# Patient Record
Sex: Female | Born: 1960 | Race: White | Hispanic: No | Marital: Married | Smoking: Never smoker
Health system: Southern US, Community
[De-identification: ages and names within clinical notes are randomized; demographics above are authoritative.]

## PROBLEM LIST (undated history)

## (undated) ENCOUNTER — Emergency Department (HOSPITAL_BASED_OUTPATIENT_CLINIC_OR_DEPARTMENT_OTHER): Payer: Managed Care, Other (non HMO)

## (undated) DIAGNOSIS — J42 Unspecified chronic bronchitis: Secondary | ICD-10-CM

## (undated) DIAGNOSIS — F32A Depression, unspecified: Secondary | ICD-10-CM

## (undated) DIAGNOSIS — T8859XA Other complications of anesthesia, initial encounter: Secondary | ICD-10-CM

## (undated) DIAGNOSIS — D649 Anemia, unspecified: Secondary | ICD-10-CM

## (undated) DIAGNOSIS — F419 Anxiety disorder, unspecified: Secondary | ICD-10-CM

## (undated) DIAGNOSIS — D126 Benign neoplasm of colon, unspecified: Secondary | ICD-10-CM

## (undated) DIAGNOSIS — K219 Gastro-esophageal reflux disease without esophagitis: Secondary | ICD-10-CM

## (undated) DIAGNOSIS — T7840XA Allergy, unspecified, initial encounter: Secondary | ICD-10-CM

## (undated) DIAGNOSIS — M1990 Unspecified osteoarthritis, unspecified site: Secondary | ICD-10-CM

## (undated) DIAGNOSIS — M8580 Other specified disorders of bone density and structure, unspecified site: Secondary | ICD-10-CM

## (undated) DIAGNOSIS — K921 Melena: Secondary | ICD-10-CM

## (undated) DIAGNOSIS — E7800 Pure hypercholesterolemia, unspecified: Secondary | ICD-10-CM

## (undated) DIAGNOSIS — T4145XA Adverse effect of unspecified anesthetic, initial encounter: Secondary | ICD-10-CM

## (undated) DIAGNOSIS — Z8719 Personal history of other diseases of the digestive system: Secondary | ICD-10-CM

## (undated) HISTORY — PX: FOOT SURGERY: SHX648

## (undated) HISTORY — PX: EYE SURGERY: SHX253

## (undated) HISTORY — PX: COLONOSCOPY: SHX174

## (undated) HISTORY — DX: Melena: K92.1

## (undated) HISTORY — DX: Other specified disorders of bone density and structure, unspecified site: M85.80

## (undated) HISTORY — PX: HAND SURGERY: SHX662

## (undated) HISTORY — PX: RECTAL POLYPECTOMY: SHX2309

## (undated) HISTORY — DX: Anxiety disorder, unspecified: F41.9

## (undated) HISTORY — DX: Benign neoplasm of colon, unspecified: D12.6

## (undated) HISTORY — DX: Allergy, unspecified, initial encounter: T78.40XA

## (undated) HISTORY — DX: Unspecified osteoarthritis, unspecified site: M19.90

## (undated) HISTORY — PX: APPENDECTOMY: SHX54

---

## 2011-04-20 DIAGNOSIS — M19049 Primary osteoarthritis, unspecified hand: Secondary | ICD-10-CM | POA: Insufficient documentation

## 2012-12-06 ENCOUNTER — Ambulatory Visit (INDEPENDENT_AMBULATORY_CARE_PROVIDER_SITE_OTHER): Payer: Managed Care, Other (non HMO) | Admitting: Family Medicine

## 2012-12-06 ENCOUNTER — Encounter: Payer: Self-pay | Admitting: Family Medicine

## 2012-12-06 ENCOUNTER — Ambulatory Visit (HOSPITAL_BASED_OUTPATIENT_CLINIC_OR_DEPARTMENT_OTHER)
Admission: RE | Admit: 2012-12-06 | Discharge: 2012-12-06 | Disposition: A | Discharge status: Home / Self Care | Payer: Managed Care, Other (non HMO) | Source: Ambulatory Visit | Attending: Family Medicine | Admitting: Family Medicine

## 2012-12-06 VITALS — BP 129/88 | HR 80

## 2012-12-06 DIAGNOSIS — M79672 Pain in left foot: Secondary | ICD-10-CM

## 2012-12-06 DIAGNOSIS — M79609 Pain in unspecified limb: Secondary | ICD-10-CM

## 2012-12-06 NOTE — Patient Instructions (Signed)
Stop running for now. Ok to cycle, swim for exercise if not painful. Icing, ibuprofen as needed for pain and swelling. Get x-rays before you leave today. We will go ahead with an MRI to further assess for stress fracture of your calcaneus.

## 2012-12-08 ENCOUNTER — Encounter: Payer: Self-pay | Admitting: Family Medicine

## 2012-12-08 DIAGNOSIS — M79672 Pain in left foot: Secondary | ICD-10-CM | POA: Insufficient documentation

## 2012-12-08 NOTE — Progress Notes (Addendum)
Patient ID: Rachel Fitzpatrick, female   DOB: [birth year], 52 y.o.   MRN: [id number]  PCP: No primary provider on file.  Subjective:   HPI: Patient is a 52 y.o. female here for left heel pain.  Patient is an avid runner. Reports on Sunday she was running at [facility] and right toes were hurting - did about 8 miles. This seemed to resolve but left heel medially started hurting. Pain significantly worse with running, having difficulty walking now. Associated swelling. Has been icing, taking ibuprofen. No known injury. No prior history of stress fracture.  History reviewed. No pertinent past medical history.  No current outpatient prescriptions on file prior to visit.   No current facility-administered medications on file prior to visit.    Past Surgical History  Procedure Laterality Date  . Hand surgery      tendon repair  . Rectal polypectomy      No Known Allergies  History   Social History  . Marital Status: Legally Separated    Spouse Name: N/A    Number of Children: N/A  . Years of Education: N/A   Occupational History  . Not on file.   Social History Main Topics  . Smoking status: Never Smoker   . Smokeless tobacco: Not on file  . Alcohol Use: Not on file  . Drug Use: Not on file  . Sexual Activity: Not on file   Other Topics Concern  . Not on file   Social History Narrative  . No narrative on file    Family History  Problem Relation Age of Onset  . Hyperlipidemia Mother   . Hyperlipidemia Father   . Diabetes Father   . Sudden death Neg Hx   . Heart attack Neg Hx   . Hypertension Neg Hx     BP 129/88  Pulse 80  Review of Systems: See HPI above.    Objective:  Physical Exam:  Gen: NAD  Left foot/ankle: Mod swelling medial calcaneus area.  No bruising, other deformity. FROM ankle without pain, 5/5 strength all directions. TTP focally medial > lateral calcaneus.  No peroneal, post tib tendon tenderness.  No malleolar, base 5th, navicular,  other foot/ankle TTP. Negative ant drawer and talar tilt.   Negative syndesmotic compression. Positive calcaneal squeeze. Positive hop test. Thompsons test negative. NV intact distally.    Assessment & Plan:  1. Left foot pain - severe pain, swelling in a runner, pain worse with running.  Focal tenderness medial calcaneus.  Concerning for a stress fracture.  Radiographs negative.  Will move forward with an MRI to further assess.  Stop running in meantime.  Icing, ibuprofen.  Addendum:  MRI denied by patient's insurance carrier.  Protocol requires 14 days of conservative treatment, reevaluation and repeat x-rays.  If those are negative then MRI would be approved.

## 2012-12-08 NOTE — Assessment & Plan Note (Signed)
severe pain, swelling in a runner, pain worse with running.  Focal tenderness medial calcaneus.  Concerning for a stress fracture.  Radiographs negative.  Will move forward with an MRI to further assess.  Stop running in meantime.  Icing, ibuprofen.

## 2012-12-19 ENCOUNTER — Ambulatory Visit (INDEPENDENT_AMBULATORY_CARE_PROVIDER_SITE_OTHER): Payer: Managed Care, Other (non HMO) | Admitting: Family Medicine

## 2012-12-19 ENCOUNTER — Ambulatory Visit (HOSPITAL_BASED_OUTPATIENT_CLINIC_OR_DEPARTMENT_OTHER)
Admission: RE | Admit: 2012-12-19 | Discharge: 2012-12-19 | Disposition: A | Discharge status: Home / Self Care | Payer: Managed Care, Other (non HMO) | Source: Ambulatory Visit | Attending: Family Medicine | Admitting: Family Medicine

## 2012-12-19 ENCOUNTER — Encounter: Payer: Self-pay | Admitting: Family Medicine

## 2012-12-19 VITALS — BP 108/71 | HR 81 | Ht 62.0 in | Wt 135.0 lb

## 2012-12-19 DIAGNOSIS — M79672 Pain in left foot: Secondary | ICD-10-CM

## 2012-12-19 DIAGNOSIS — M79609 Pain in unspecified limb: Secondary | ICD-10-CM | POA: Insufficient documentation

## 2012-12-20 ENCOUNTER — Ambulatory Visit: Payer: Managed Care, Other (non HMO) | Admitting: Family Medicine

## 2012-12-20 ENCOUNTER — Encounter: Payer: Self-pay | Admitting: Family Medicine

## 2012-12-20 NOTE — Assessment & Plan Note (Signed)
severe pain, swelling in a runner, pain worse with running.  Repeated radiographs after 2 weeks of rest and still negative - will move forward with an MRI to further assess for calcaneal stress fracture. Icing, ibuprofen.

## 2012-12-20 NOTE — Patient Instructions (Signed)
Moving forward with MRI as discussed.

## 2012-12-20 NOTE — Progress Notes (Signed)
Patient ID: Rachel Fitzpatrick, female   DOB: 1961/02/15, 52 y.o.   MRN: [id number]  PCP: No primary provider on file.  Subjective:   HPI: Patient is a 52 y.o. female here for left heel pain.  10/8: Patient is an avid runner. Reports on Sunday she was running at [facility] and right toes were hurting - did about 8 miles. This seemed to resolve but left heel medially started hurting. Pain significantly worse with running, having difficulty walking now. Associated swelling. Has been icing, taking ibuprofen. No known injury. No prior history of stress fracture.  10/21: Patient returns with persistent 8/10 pain. Tried using a walking hard soled shoe and still a lot of pain. Has not been running. Icing. Some swelling. Pain medial and lateral heel.  History reviewed. No pertinent past medical history.  Current Outpatient Prescriptions on File Prior to Visit  Medication Sig Dispense Refill  . Calcium Carbonate (CALTRATE 600 PO) Take by mouth.      . estrogens conjugated, synthetic A, (CENESTIN) 0.3 MG tablet Take 0.3 mg by mouth daily.      . sertraline (ZOLOFT) 100 MG tablet Take 100 mg by mouth daily.       No current facility-administered medications on file prior to visit.    Past Surgical History  Procedure Laterality Date  . Hand surgery      tendon repair  . Rectal polypectomy      No Known Allergies  History   Social History  . Marital Status: Legally Separated    Spouse Name: N/A    Number of Children: N/A  . Years of Education: N/A   Occupational History  . Not on file.   Social History Main Topics  . Smoking status: Never Smoker   . Smokeless tobacco: Not on file  . Alcohol Use: Not on file  . Drug Use: Not on file  . Sexual Activity: Not on file   Other Topics Concern  . Not on file   Social History Narrative  . No narrative on file    Family History  Problem Relation Age of Onset  . Hyperlipidemia Mother   . Hyperlipidemia Father   .  Diabetes Father   . Sudden death Neg Hx   . Heart attack Neg Hx   . Hypertension Neg Hx     BP 108/71  Pulse 81  Ht 5\' 2"  (1.575 m)  Wt 135 lb (61.236 kg)  BMI 24.69 kg/m2  Review of Systems: See HPI above.    Objective:  Physical Exam:  Gen: NAD  Left foot/ankle: Mild-mod swelling medial, lateral  calcaneus area.  No bruising, other deformity. FROM ankle without pain, 5/5 strength all directions. TTP focally medial > lateral calcaneus.  No peroneal, post tib tendon tenderness.  No malleolar, base 5th, navicular, other foot/ankle TTP. Negative ant drawer and talar tilt.   Negative syndesmotic compression. Positive calcaneal squeeze. Positive hop test. Thompsons test negative. NV intact distally.    Assessment & Plan:  1. Left foot pain - severe pain, swelling in a runner, pain worse with running.  Repeated radiographs after 2 weeks of rest and still negative - will move forward with an MRI to further assess for calcaneal stress fracture. Icing, ibuprofen.

## 2012-12-23 ENCOUNTER — Ambulatory Visit (HOSPITAL_BASED_OUTPATIENT_CLINIC_OR_DEPARTMENT_OTHER)
Admission: RE | Admit: 2012-12-23 | Discharge: 2012-12-23 | Disposition: A | Discharge status: Home / Self Care | Payer: Managed Care, Other (non HMO) | Source: Ambulatory Visit | Attending: Family Medicine | Admitting: Family Medicine

## 2012-12-23 DIAGNOSIS — X58XXXA Exposure to other specified factors, initial encounter: Secondary | ICD-10-CM | POA: Insufficient documentation

## 2012-12-23 DIAGNOSIS — M79672 Pain in left foot: Secondary | ICD-10-CM

## 2012-12-23 DIAGNOSIS — R609 Edema, unspecified: Secondary | ICD-10-CM | POA: Insufficient documentation

## 2012-12-23 DIAGNOSIS — M8430XA Stress fracture, unspecified site, initial encounter for fracture: Secondary | ICD-10-CM | POA: Insufficient documentation

## 2012-12-23 DIAGNOSIS — S93439A Sprain of tibiofibular ligament of unspecified ankle, initial encounter: Secondary | ICD-10-CM | POA: Insufficient documentation

## 2012-12-23 DIAGNOSIS — M25579 Pain in unspecified ankle and joints of unspecified foot: Secondary | ICD-10-CM | POA: Insufficient documentation

## 2012-12-26 ENCOUNTER — Other Ambulatory Visit: Payer: Self-pay | Admitting: *Deleted

## 2012-12-26 ENCOUNTER — Ambulatory Visit (INDEPENDENT_AMBULATORY_CARE_PROVIDER_SITE_OTHER): Payer: Managed Care, Other (non HMO) | Admitting: Family Medicine

## 2012-12-26 DIAGNOSIS — M79609 Pain in unspecified limb: Secondary | ICD-10-CM

## 2012-12-26 DIAGNOSIS — M79672 Pain in left foot: Secondary | ICD-10-CM

## 2012-12-26 NOTE — Progress Notes (Signed)
Patient's MRI was reviewed and discussed - she does have a posterior calcaneal stress fracture confirmed by the MRI.  Still in a lot of pain.  Discussed options of cam walker, casting, knee scooter, crutches.  Will follow up in about 4 weeks.  Patient came in to the office, got Cam Walker and Crutches.

## 2013-01-17 ENCOUNTER — Encounter: Payer: Self-pay | Admitting: Sports Medicine

## 2013-01-17 ENCOUNTER — Ambulatory Visit (INDEPENDENT_AMBULATORY_CARE_PROVIDER_SITE_OTHER): Payer: Managed Care, Other (non HMO) | Admitting: Sports Medicine

## 2013-01-17 VITALS — BP 114/73 | Ht 62.0 in | Wt 139.0 lb

## 2013-01-17 DIAGNOSIS — M216X9 Other acquired deformities of unspecified foot: Secondary | ICD-10-CM

## 2013-01-17 DIAGNOSIS — M8430XA Stress fracture, unspecified site, initial encounter for fracture: Secondary | ICD-10-CM

## 2013-01-17 DIAGNOSIS — M21619 Bunion of unspecified foot: Secondary | ICD-10-CM | POA: Insufficient documentation

## 2013-01-17 DIAGNOSIS — M2010 Hallux valgus (acquired), unspecified foot: Secondary | ICD-10-CM

## 2013-01-17 DIAGNOSIS — M84376A Stress fracture, unspecified foot, initial encounter for fracture: Secondary | ICD-10-CM | POA: Insufficient documentation

## 2013-01-17 NOTE — Patient Instructions (Signed)
Thank you for coming in today  Return in 3 weeks and bring your running shoes We will need to make you an orthotic in the future Wear boot for 3 more weeks Do exercises: - Quad: Straight leg raise, leg extension - Hamstring: curls, leg extension - Hip: Hip abduction, hip rotation Try OTC hydrocortisone cream for rash

## 2013-01-17 NOTE — Progress Notes (Signed)
CC: Left calcaneal stress fracture HPI: Patient is a very pleasant 52 year old female runner who presents for followup of left calcaneal stress fracture. She states that this happened on October 5 when she was doing a mile run. She had pain in her left heel. She went and saw Dr. Pearletha Forge who had concern for a stress fracture but ultrasound and x-ray were negative. He instructed her not to run. Her pain worsened over the next several days to the point where she couldn't really even walk. An MRI was ordered but initially denied by her insurance company. She finally was able to obtain an MRI on October 26 which showed a left calcaneal stress fracture. She was then put in a Personal assistant. She has been in a boot for the last 3 weeks. Prior to onset of her pain she was training for a half marathon and doing about 16 miles per week running 3 days per week. There were slowly increasing her mileage by about 1 mile per week. She did run a new shoes that day. She has previously had bunion surgery on the left foot. She has been wearing super feet shoe insoles.   ROS: As above in the HPI. All other systems are stable or negative.  OBJECTIVE: APPEARANCE:  Patient in no acute distress.The patient appeared well nourished and normally developed. HEENT: No scleral icterus. Conjunctiva non-injected Resp: Non labored Skin: No rash MSK:  - She is exquisitely tender over the left medial calcaneus -  Status post bunion surgery on the left - There are bunionette bilaterally - Large right bunion - Cavus foot bilaterally - Left leg is 1 cm longer  ASSESSMENT:  #1. Left calcaneal stress fracture  PLAN:  given and the patient is still extremely tender on exam, we'll continue her in the boot for the next 3 weeks. She was given some quad, hamstring, and hip exercises to maintain strength. She may continue to do her upper body exercises at the gym. She does have biomechanical and foot structure factors that predispose  her to this condition so she will likely need custom orthotics with first ray post in the future. When she returns she should bring her running shoes. If she is nontender at that time may consider ultrasound to look for healing.

## 2013-02-01 ENCOUNTER — Encounter: Payer: Self-pay | Admitting: Sports Medicine

## 2013-02-01 ENCOUNTER — Ambulatory Visit (INDEPENDENT_AMBULATORY_CARE_PROVIDER_SITE_OTHER): Payer: Managed Care, Other (non HMO) | Admitting: Sports Medicine

## 2013-02-01 VITALS — BP 118/80 | HR 81 | Ht 62.0 in | Wt 139.0 lb

## 2013-02-01 DIAGNOSIS — Z780 Asymptomatic menopausal state: Secondary | ICD-10-CM

## 2013-02-01 DIAGNOSIS — M8438XD Stress fracture, other site, subsequent encounter for fracture with routine healing: Secondary | ICD-10-CM

## 2013-02-01 DIAGNOSIS — S92009D Unspecified fracture of unspecified calcaneus, subsequent encounter for fracture with routine healing: Secondary | ICD-10-CM

## 2013-02-01 DIAGNOSIS — M8448XD Pathological fracture, other site, subsequent encounter for fracture with routine healing: Secondary | ICD-10-CM

## 2013-02-01 DIAGNOSIS — IMO0001 Reserved for inherently not codable concepts without codable children: Secondary | ICD-10-CM

## 2013-02-01 NOTE — Progress Notes (Signed)
Patient ID: Rachel Fitzpatrick, female   DOB: Jun 13, 1960, 52 y.o.   MRN: [id number]  Patient is returning for follow up of calcaneal stress fracture On MRI this is very large an looks to be Grade 4 Occurred from running  No Hx of osteoporosis [place] descent LMP > 1 year ago  Now 70 to 80 % less pain since in boot She does feel pain with walking in shoe at home but not as severe  Consideration of whether to start on orthotics  PEXAM Patient has boot on left foot When removed only mildly TTP now but was severe last visit No obvious swelling Loss of long arch Bunion scar on LT  RT foot shows large bunion Callus under MT2 and 3  Standing Grt toe rides over toe 2  MRI reviewed with grade 4 stress fx  [place] today shows that she is starting to develop callus medial and lateral calcaneus

## 2013-02-01 NOTE — Patient Instructions (Signed)
You have been scheduled for a bone density test on 02/08/13 at 1:15 pm at  [facility] at [facility] [address] [place] [phone]

## 2013-02-01 NOTE — Assessment & Plan Note (Addendum)
This is advanced - grade 4 by MRI  Not able to walk in sport shoe without limp Heel cup makes pain less  Keep up Ca and Vit D Add Vit C 500  Follow w [place] since I can see Fx line  Check DEXA since this is an advanced FX  Cont boot for next month Scan tis on RTC Try to move to custom orthotic when no limp and less pain  On RT foot apply MT pad and see if this helps

## 2013-02-08 ENCOUNTER — Ambulatory Visit
Admission: RE | Admit: 2013-02-08 | Discharge: 2013-02-08 | Disposition: A | Discharge status: Home / Self Care | Payer: Managed Care, Other (non HMO) | Source: Ambulatory Visit | Attending: Sports Medicine | Admitting: Sports Medicine

## 2013-02-08 DIAGNOSIS — Z780 Asymptomatic menopausal state: Secondary | ICD-10-CM

## 2013-03-06 ENCOUNTER — Encounter: Payer: Self-pay | Admitting: Sports Medicine

## 2013-03-06 ENCOUNTER — Ambulatory Visit (INDEPENDENT_AMBULATORY_CARE_PROVIDER_SITE_OTHER): Payer: Managed Care, Other (non HMO) | Admitting: Sports Medicine

## 2013-03-06 VITALS — BP 110/70 | Ht 62.0 in | Wt 139.0 lb

## 2013-03-06 DIAGNOSIS — M8438XG Stress fracture, other site, subsequent encounter for fracture with delayed healing: Secondary | ICD-10-CM

## 2013-03-06 DIAGNOSIS — M8448XD Pathological fracture, other site, subsequent encounter for fracture with routine healing: Secondary | ICD-10-CM

## 2013-03-06 NOTE — Patient Instructions (Addendum)
Keep boot around if too much foot pain  This is healing well  Use 1200 Calcium and 800 IU Vit D every day  Start with some elliptical - try 10 mins a day x 3 along with some biking (time OK) After 3 days - try 15 mins on ellipitical x [redacted] week along with biking Week 2 - 20 minutes a day plus bike Week 3 - 25 mins. Week 4 30 mins  Then you are OK to try some regular walk run 24min/ 26min up to 20 mins. Next week up to 26 Mins Next week up to 32 mins  ORTHOTICS HERE  Then OK to run continuously 20 mins Add 5 mins per week  See me in 2 months  If pain back down 1 level after 2 days of rest

## 2013-03-07 NOTE — Progress Notes (Signed)
Patient ID: Rachel Fitzpatrick, female   DOB: 17-Jan-1961, 53 y.o.   MRN: [id number]  Patient returns now 8 weeks after left calcaneal stress fracture This was documented on MRI This was a high-grade stress fracture at least grade 4 She wears a cam walker  On last visit she should callus on ultrasound  She is taking calcium and vitamin D   Bone density testing showed osteopenia  Pain is minimal now  Physical examination No acute distress BP 110/70  Ht 5\' 2"  (1.575 m)  Wt 139 lb (63.05 kg)  BMI 25.42 kg/m2  Squeeze test of calcaneus is nontender No swelling Achilles tendon nontender Plantar fascia nontender  Ultrasound shows what appears to be hard callus with normal healing of her stress fracture  Walking gait is without any significantly limp

## 2013-03-07 NOTE — Assessment & Plan Note (Signed)
Very nice progress with this stress fracture  Continue calcium and vitamin D Continue vitamin C  Gradually progressive exercise program  Custom orthotics before beginning a running program  Recheck 2 months

## 2013-05-08 ENCOUNTER — Encounter: Payer: Self-pay | Admitting: Sports Medicine

## 2013-05-08 ENCOUNTER — Ambulatory Visit (INDEPENDENT_AMBULATORY_CARE_PROVIDER_SITE_OTHER): Payer: Managed Care, Other (non HMO) | Admitting: Sports Medicine

## 2013-05-08 VITALS — BP 111/74 | HR 73 | Ht 62.0 in | Wt 139.0 lb

## 2013-05-08 DIAGNOSIS — M216X9 Other acquired deformities of unspecified foot: Secondary | ICD-10-CM

## 2013-05-08 DIAGNOSIS — M21619 Bunion of unspecified foot: Secondary | ICD-10-CM

## 2013-05-08 DIAGNOSIS — M8430XA Stress fracture, unspecified site, initial encounter for fracture: Secondary | ICD-10-CM

## 2013-05-08 DIAGNOSIS — M545 Low back pain, unspecified: Secondary | ICD-10-CM

## 2013-05-08 DIAGNOSIS — M84376A Stress fracture, unspecified foot, initial encounter for fracture: Secondary | ICD-10-CM

## 2013-05-08 NOTE — Progress Notes (Signed)
Patient ID: MINNAH LLAMAS, female   DOB: 12/07/1960, 53 y.o.   MRN: [id number]  PATIENT returns for follow up visit for calcneal stress fracture On last visit she had hard callus by [place] Started on progressive increase in run/walk Now up to 40 mins without left heel pain  Now more pain under RT MT area Comes for orthotics before restarting training  Exam  NAD Leg length OK RT foot bunion and bunionette Loss of transverse arch Drop of 4th MT  LT foot Bunionette Scar from bunion surgery Heel squeeze and percussion are pain free

## 2013-05-08 NOTE — Assessment & Plan Note (Signed)
Monitor to see if improved with orthotics

## 2013-05-08 NOTE — Assessment & Plan Note (Signed)
[place] today shows no sign of stress fx/ exam neg  OK to return to full training

## 2013-05-08 NOTE — Assessment & Plan Note (Signed)
Considering bilat bunion and bunionettes and now with calcaneal stress fx that has healed  Needs to use orthtoics to see if we can stop foot breakdown  Patient was fitted for a : standard, cushioned, semi-rigid orthotic. The orthotic was heated and afterward the patient stood on the orthotic blank positioned on the orthotic stand. The patient was positioned in subtalar neutral position and 10 degrees of ankle dorsiflexion in a weight bearing stance. After completion of molding, a stable base was applied to the orthotic blank. The blank was ground to a stable position for weight bearing. Size: 7 Base: red EVA Posting: RT MT cushion on plantar surface of orthotic Additional orthotic padding: none  Time for evaluationa nd preparation of orthotic - 50 mins  Reck after using 2 to 3 mos

## 2013-05-24 ENCOUNTER — Telehealth: Payer: Self-pay | Admitting: *Deleted

## 2013-05-24 NOTE — Telephone Encounter (Signed)
Per Dr. Oneida Alar advised pt she was ahead of schedule on healing, and she may have been doing too much too soon.  Dr. Oneida Alar advised her to walk on the treadmill, use elliptical or bike for the next 7-10 days, then ease slowly back in to run/walk program.  Let us know if she continues to have pain. Pt agreeable.

## 2013-05-24 NOTE — Telephone Encounter (Signed)
Pt states she ran/walked a 5K on Saturday and ran/walked on Monday and started having mild left heel pain.  She was wearing new custom orthotics.

## 2013-05-24 NOTE — Telephone Encounter (Signed)
Message copied by Arnette Schaumann C on Thu May 24, 2013 10:22 AM ------      Message from: CERESI, MELANIE L      Created: Thu May 24, 2013  9:53 AM      Regarding: question      Contact: [phone]       Pt states her foot is painful, she only ran a little bit on Sunday and Monday.  Shes not sure if its the orthotics or not.  Wanted to talk to you before scheduling an appt or coming in for adjustments. ------

## 2013-06-08 ENCOUNTER — Telehealth: Payer: Self-pay | Admitting: *Deleted

## 2013-06-08 NOTE — Telephone Encounter (Signed)
Pt states her heel is still painful, worried that she needs to be back in boot- scheduled her with Dr Micheline Chapman Monday 06/11/13 to check on it.

## 2013-06-11 ENCOUNTER — Ambulatory Visit
Admission: RE | Admit: 2013-06-11 | Discharge: 2013-06-11 | Disposition: A | Discharge status: Home / Self Care | Payer: Managed Care, Other (non HMO) | Source: Ambulatory Visit | Attending: Sports Medicine | Admitting: Sports Medicine

## 2013-06-11 ENCOUNTER — Ambulatory Visit (INDEPENDENT_AMBULATORY_CARE_PROVIDER_SITE_OTHER): Payer: Managed Care, Other (non HMO) | Admitting: Sports Medicine

## 2013-06-11 ENCOUNTER — Encounter: Payer: Self-pay | Admitting: Sports Medicine

## 2013-06-11 VITALS — BP 114/61 | Ht 61.0 in | Wt 145.0 lb

## 2013-06-11 DIAGNOSIS — M79672 Pain in left foot: Secondary | ICD-10-CM

## 2013-06-11 DIAGNOSIS — M84376A Stress fracture, unspecified foot, initial encounter for fracture: Secondary | ICD-10-CM

## 2013-06-11 DIAGNOSIS — M8430XA Stress fracture, unspecified site, initial encounter for fracture: Secondary | ICD-10-CM

## 2013-06-11 DIAGNOSIS — M79609 Pain in unspecified limb: Secondary | ICD-10-CM

## 2013-06-11 NOTE — Progress Notes (Signed)
   Subjective:    Patient ID: Rachel Fitzpatrick, female    DOB: May 28, 1960, 53 y.o.   MRN: [id number]  HPI Patient comes in today with returning left heel pain. She has a well-documented history of a previous stress fracture in this same heel. Last office visit was on March 10 with Dr.Fields. At that time, she was feeling pretty good. No pain. Prior ultrasounds showed good evidence of stress fracture healing. Patient had return to running but 2 weeks ago began to notice returning pain and swelling along the lateral calcaneus which was identical in nature to the pain that she had experienced with her previous stress fracture. She rested for about 10 days and then tried running again but she was only able to get 3 minutes into her run before her pain returned. All of her pain is along the lateral calcaneus. She was also fitted with custom orthotics at her last office visit and is wondering whether or not they may be responsible for her returning pain.    Review of Systems     Objective:   Physical Exam Well-developed, well-nourished. No acute distress. Awake alert and oriented x3. Vital signs reviewed  Left heel: Pain with calcaneal squeeze. There is mild soft tissue swelling along the lateral calcaneus. No tenderness to palpation along the peroneal tendons. Neurovascularly intact distally. Walking with a slight limp.  MSK ultrasound of the left calcaneus was performed. Images were obtained to prior images obtained by Dr Oneida Alar. There still appears to be some cortical irregularity along the lateral border of the calcaneus but there does appear to be some overlying callus as well.      Assessment & Plan:  Returning left heel pain and swelling status post recent calcaneal stress fracture  Given her return of symptoms, I think the patient should return to crosstraining for the next 3 weeks (elliptical). I would also like to get a plain x-ray of her heel. I reassured the patient that I do not think  her orthotics are the reason for her returning symptoms. Followup with me in 3 weeks on a day that both myself and Dr. Oneida Alar are present. Of note, the patient is currently on supplemental calcium and vitamin D.

## 2013-06-19 ENCOUNTER — Telehealth: Payer: Self-pay | Admitting: Sports Medicine

## 2013-06-19 NOTE — Telephone Encounter (Signed)
Message copied by Thurman Coyer on Tue Jun 19, 2013  8:33 AM ------      Message from: CERESI, Threasa Beards L      Created: Mon Jun 18, 2013  2:25 PM      Regarding: heel xray result?      Contact: [phone]       Pt called looking for her xray results. ------

## 2013-06-19 NOTE — Telephone Encounter (Signed)
I spoke with Rachel Fitzpatrick on the phone yesterday regarding x-rays of her left calcaneus. There is calcaneal sclerosis consistent with a healed stress fracture. She is currently having no pain with walking. I recommended that she slowly start to return to running but if she has any returning pain she will need to back off. She has a followup appointment with Dr.Fields in 2 weeks.

## 2013-07-04 ENCOUNTER — Ambulatory Visit (INDEPENDENT_AMBULATORY_CARE_PROVIDER_SITE_OTHER): Payer: Managed Care, Other (non HMO) | Admitting: Sports Medicine

## 2013-07-04 ENCOUNTER — Encounter: Payer: Self-pay | Admitting: Sports Medicine

## 2013-07-04 VITALS — BP 101/69 | Ht 62.0 in | Wt 145.0 lb

## 2013-07-04 DIAGNOSIS — M84376A Stress fracture, unspecified foot, initial encounter for fracture: Secondary | ICD-10-CM

## 2013-07-04 DIAGNOSIS — M8430XA Stress fracture, unspecified site, initial encounter for fracture: Secondary | ICD-10-CM

## 2013-07-04 NOTE — Progress Notes (Signed)
   Subjective:    Patient ID: Rachel Fitzpatrick, female    DOB: [birth year], 53 y.o.   MRN: [id number]  HPI Patient comes in today for followup on her left calcaneal stress fracture. Overall, she is doing very well. She is return to a run walk program and last week was able to run/walk 3 miles without any returning pain. Her main complaint is a feeling of fullness along the left arch from her new orthotics. It is not painful but just a little uncomfortable. Otherwise, she is without complaint.    Review of Systems     Objective:   Physical Exam Well-developed, well-nourished. No acute distress.  Left heel: Mild pain with calcaneal squeeze. No soft tissue swelling. Neurovascularly intact distally. Walking without a limp.  MSK ultrasound of the left calcaneus: Long and short images were obtained. There is diffuse abundant callus throughout. X-rays of her left calcaneus dated 06/11/2013 also shows callus through the calcaneus consistent with a healed fracture.       Assessment & Plan:  Healed calcaneal stress fracture, left foot  Patient has good objective evidence of healing on both ultrasound and plain x-ray. However, I think she should still be cautious with her activity and use pain as her guide. If she begins to experience any returning pain she will need to decrease her running and cross train a little more. I did shave down the EVA a little bit in the arch of the left orthotic to try to make it a little more comfortable for her. We will schedule a tentative followup appointment for 4 weeks from now but if the patient is pain-free and doing well she may feel free to cancel that appointment to followup when necessary.

## 2013-08-15 ENCOUNTER — Ambulatory Visit: Payer: Managed Care, Other (non HMO) | Admitting: Sports Medicine

## 2013-09-14 ENCOUNTER — Encounter: Payer: Self-pay | Admitting: Family Medicine

## 2013-09-14 ENCOUNTER — Ambulatory Visit (INDEPENDENT_AMBULATORY_CARE_PROVIDER_SITE_OTHER): Payer: Managed Care, Other (non HMO) | Admitting: Family Medicine

## 2013-09-14 VITALS — BP 113/77 | HR 74 | Ht 61.0 in | Wt 145.0 lb

## 2013-09-14 DIAGNOSIS — M25569 Pain in unspecified knee: Secondary | ICD-10-CM

## 2013-09-14 DIAGNOSIS — M25561 Pain in right knee: Secondary | ICD-10-CM

## 2013-09-14 NOTE — Progress Notes (Signed)
  Rachel Fitzpatrick - 53 y.o. female MRN [id number]  Date of birth: [birth year]    SUBJECTIVE:     53 year old female runner presents with complaints of right knee pain.  The patient reports she has had medial right knee pain for the past 3 weeks. She said that this came on insidiously. Pain is moderate in severity and worse with physical activity (i.e. Running).  She denies any recent trauma, injury, fall. She denies any associated popping, clicking, grinding.  She says that she has had a few instances where she feels like it's going to give way. No relieving factors.  She has started using a knee sleeve with some relief. Her pain is also had cut back on her running.   She is currently running approximately 10 miles a week.    Regarding her prior calcaneal stress fracture, she states she is doing well.  Her pain is now completely resolved. ROS:     Per HPI.   PERTINENT  PMH / PSH FH / / SH:  Past Medical, Surgical, Social, and Family History Reviewed & Updated per EMR.   Pertinent Historical Findings include: Prior left calcaneal stress fracture 11/2012.  OBJECTIVE: BP 113/77  Pulse 74  Ht 5\' 1"  (1.549 m)  Wt 145 lb (65.772 kg)  BMI 27.41 kg/m2  Physical Exam:  Vital signs are reviewed. General: well appearing female, pleasant; NAD.  MSK - Knee (right) Normal to inspection with no erythema or obvious bony abnormalities. Mild swelling noted. Palpation normal with no warmth, patellar tenderness, or condyle tenderness. + medial joint line tenderness. ROM full in flexion and extension and lower leg rotation. Ligaments with solid consistent endpoints including ACL, PCL, LCL, MCL. Negative Mcmurray's.  Non painful patellar compression. Patellar glide without crepitus. Patellar and quadriceps tendons unremarkable. Hamstring and quadriceps strength is normal.   Gait - Genu valgus noted. Short stride. Forefoot striker.  MSK [place] [employer] - Right knee - Normal quad and patellar  tendons. - Medial meniscus intact with no evidence of tear.  There was hypoechoic changes surrounding her meniscus indicating edema/fluid. - Lateral meniscus - normal.   ASSESSMENT & PLAN: See problem based charting & AVS for pt instructions.

## 2013-09-14 NOTE — Assessment & Plan Note (Signed)
Patient with edema/fluid around her medial meniscus the ultrasound.  Patient clinically with medial right knee pain consistent with meniscal pathology. This is likely secondary to gait abnormalities and loss of transverse arch. Knee and abductor strengthening exercises given today. Orthotic adjusted with addition of pad to restore loss of transverse arch. Patient to follow up as needed.

## 2013-09-17 NOTE — Progress Notes (Signed)
[place] Attending Note: I have seen and examined this patient. I have discussed this patient with the resident and reviewed the assessment and plan as documented above. I agree with the resident's findings and plan. Her left heel pain seems totally resolved. I squeezed it really hard in clinic today and she had no pain. His head no pain with running.  Right knee pain I think is from her be a mild attachment. Some increased stress on this as she returns for an. Evaluate her gait revealed small amount of right leg crossover during swing phase. We will aggressively work on strengthening her abduction hip muscles  Also upgraded her metatarsal pad in the right foot. I think she has a little bit of a degenerative meniscus with some overuse which is causing the fluid seen on ultrasound. We talked about gradually returning to running. She'll followup to 4 weeks and when necessary

## 2013-09-18 ENCOUNTER — Ambulatory Visit: Payer: Managed Care, Other (non HMO) | Admitting: Sports Medicine

## 2014-01-10 ENCOUNTER — Other Ambulatory Visit: Payer: Self-pay | Admitting: Family Medicine

## 2014-01-10 DIAGNOSIS — Z1231 Encounter for screening mammogram for malignant neoplasm of breast: Secondary | ICD-10-CM

## 2014-01-11 ENCOUNTER — Ambulatory Visit (HOSPITAL_BASED_OUTPATIENT_CLINIC_OR_DEPARTMENT_OTHER)
Admission: RE | Admit: 2014-01-11 | Discharge: 2014-01-11 | Disposition: A | Discharge status: Home / Self Care | Payer: Managed Care, Other (non HMO) | Source: Ambulatory Visit | Attending: Family Medicine | Admitting: Family Medicine

## 2014-01-11 DIAGNOSIS — Z1231 Encounter for screening mammogram for malignant neoplasm of breast: Secondary | ICD-10-CM | POA: Diagnosis present

## 2014-04-04 LAB — HEPATIC FUNCTION PANEL
ALT: 11 U/L (ref 7–35)
AST: 22 U/L (ref 13–35)
Alkaline Phosphatase: 61 U/L (ref 25–125)
Bilirubin, Total: 0.5 mg/dL

## 2014-04-04 LAB — BASIC METABOLIC PANEL
POTASSIUM: 4.6 mmol/L (ref 3.4–5.3)
Sodium: 139 mmol/L (ref 137–147)

## 2014-04-04 LAB — LIPID PANEL
CHOLESTEROL: 330 mg/dL — AB (ref 0–200)
HDL: 123 mg/dL — AB (ref 35–70)
LDL Cholesterol: 192 mg/dL
LDl/HDL Ratio: 2.7
TRIGLYCERIDES: 74 mg/dL (ref 40–160)

## 2014-04-04 LAB — TSH: TSH: 0.85 u[IU]/mL (ref <=5.90)

## 2014-04-04 LAB — CBC AND DIFFERENTIAL
HCT: 41 % (ref 36–46)
HEMOGLOBIN: 13.8 g/dL (ref 12.0–16.0)
Neutrophils Absolute: 2124 /uL
Platelets: 181 10*3/uL (ref 150–399)
WBC: 4.3 10*3/mL

## 2014-04-04 LAB — HEMOGLOBIN A1C: Hgb A1c MFr Bld: 5.6 % (ref 4.0–6.0)

## 2014-04-17 ENCOUNTER — Other Ambulatory Visit: Payer: Self-pay | Admitting: Nurse Practitioner

## 2014-04-17 ENCOUNTER — Ambulatory Visit
Admission: RE | Admit: 2014-04-17 | Discharge: 2014-04-17 | Disposition: A | Discharge status: Home / Self Care | Payer: Managed Care, Other (non HMO) | Source: Ambulatory Visit | Attending: Nurse Practitioner | Admitting: Nurse Practitioner

## 2014-04-17 DIAGNOSIS — N61 Mastitis without abscess: Secondary | ICD-10-CM

## 2014-04-26 ENCOUNTER — Other Ambulatory Visit: Payer: Self-pay | Admitting: Nurse Practitioner

## 2014-04-26 ENCOUNTER — Ambulatory Visit
Admission: RE | Admit: 2014-04-26 | Discharge: 2014-04-26 | Disposition: A | Discharge status: Home / Self Care | Payer: Managed Care, Other (non HMO) | Source: Ambulatory Visit | Attending: Nurse Practitioner | Admitting: Nurse Practitioner

## 2014-04-26 DIAGNOSIS — N61 Mastitis without abscess: Secondary | ICD-10-CM

## 2014-05-06 ENCOUNTER — Telehealth: Payer: Self-pay

## 2014-05-06 NOTE — Telephone Encounter (Signed)
Medication: Review, verify sig & reconcile(including outside meds):  yes Duplicates discarded: n/a DM supply source: n/a  Preferred Pharmacy and which med where: 90 day supply/mail order:  [facility] [place], [place] pharmacy:  [id number] - [place], [place]  Allergies verified:  yes  Immunization Status: Prompted for insurance verification: n/a Flu vaccine-- 11/2013 patient reported (received at work) [place] of date, but states she's up-to-date; will bring in date during appt.     A/P:   Changes to FH, PSH or Personal Hx: Reviewed. No changes. CCS:  03/2009-[facility] in [place]; repeat 5 years  DUE Mmg: 01/11/14- Bilateral--negative; 04/17/14- right breast- benign finding- right breast abcess Pap-- Unsure of date, will bring with her during appt; [place] Teams Updated: ED/Hospital/Urgent Care Visits:  [facility]- 04/16/14 for right breast  Prompted for: Updated insurance, contact information, forms: yes Remind to bring: DPR information, advance directives: yes  To Discuss with Provider:  Colonoscopy and assessment of right breast

## 2014-05-08 ENCOUNTER — Ambulatory Visit (INDEPENDENT_AMBULATORY_CARE_PROVIDER_SITE_OTHER): Payer: Managed Care, Other (non HMO) | Admitting: Family Medicine

## 2014-05-08 ENCOUNTER — Encounter: Payer: Self-pay | Admitting: Family Medicine

## 2014-05-08 VITALS — BP 118/64 | HR 73 | Temp 97.6°F | Resp 16 | Ht 61.5 in | Wt 152.2 lb

## 2014-05-08 DIAGNOSIS — K625 Hemorrhage of anus and rectum: Secondary | ICD-10-CM

## 2014-05-08 DIAGNOSIS — N611 Abscess of the breast and nipple: Secondary | ICD-10-CM | POA: Insufficient documentation

## 2014-05-08 DIAGNOSIS — F411 Generalized anxiety disorder: Secondary | ICD-10-CM | POA: Insufficient documentation

## 2014-05-08 DIAGNOSIS — Z1283 Encounter for screening for malignant neoplasm of skin: Secondary | ICD-10-CM | POA: Insufficient documentation

## 2014-05-08 DIAGNOSIS — E785 Hyperlipidemia, unspecified: Secondary | ICD-10-CM

## 2014-05-08 DIAGNOSIS — N61 Inflammatory disorders of breast: Secondary | ICD-10-CM

## 2014-05-08 DIAGNOSIS — R87619 Unspecified abnormal cytological findings in specimens from cervix uteri: Secondary | ICD-10-CM

## 2014-05-08 NOTE — Progress Notes (Signed)
Pre visit review using our clinic review tool, if applicable. No additional management support is needed unless otherwise documented below in the visit note. 

## 2014-05-08 NOTE — Progress Notes (Signed)
   Subjective:    Patient ID: Rachel Fitzpatrick, female    DOB: 1960-12-03, 54 y.o.   MRN: [id number]  HPI New to establish.  Previous MD- [place] [facility] ([employer])  [facility], UTD on pap.  Hx of abnormal pap- March 2015 had abnormal pap w/ colposcopy (normal per pt report).  Had normal on repeat pap 11/07/13.  Was told in Sept to repeat pap in 1 year.  Skin cancer check- pt is asking for referral to Derm due to hx of recalls for wide excision.  Anxiety- chronic problem, currently on Zoloft.  sxs are mostly well controlled but will occasionally have breakthrough sxs.  No recent panic attacks.  Exercising as stress outlet.  R breast abscess- pt reports area has nearly complete resolved but is due for repeat US this week.  Pt is asking if she needs to follow through on that.  BRPBR- pt reports bleeding will only occur if running more than 3 miles.  Bleeding does not need to accompany a BM.  Pt reports 'it's a mucousy blood'.  Denies pain but reports rectal pressure.  Pt has hx of surgical polypectomy.  After surgery, pt has residual fecal urgency.  sxs started within the last few months and doesn't occur w/ each run.  Hyperlipidemia- pt's last labs showed elevated total cholesterol, HDL, LDL.  meds were not recommended at that time despite LDL >150.  Her HDL is so good that it serves as negative risk factor.   Review of Systems For ROS see HPI     Objective:   Physical Exam  Constitutional: She is oriented to person, place, and time. She appears well-developed and well-nourished. No distress.  HENT:  Head: Normocephalic and atraumatic.  Eyes: Conjunctivae and EOM are normal. Pupils are equal, round, and reactive to light.  Neck: Normal range of motion. Neck supple. No thyromegaly present.  Cardiovascular: Normal rate, regular rhythm, normal heart sounds and intact distal pulses.   No murmur heard. Pulmonary/Chest: Effort normal and breath sounds normal. No  respiratory distress.  Abdominal: Soft. She exhibits no distension. There is no tenderness.  Genitourinary: Rectal exam shows no external hemorrhoid, no internal hemorrhoid, no fissure, no mass, no tenderness and anal tone normal. Guaiac negative stool.  Musculoskeletal: She exhibits no edema.  Lymphadenopathy:    She has no cervical adenopathy.  Neurological: She is alert and oriented to person, place, and time.  Skin: Skin is warm and dry.  Psychiatric: She has a normal mood and affect. Her behavior is normal.  Vitals reviewed.         Assessment & Plan:

## 2014-05-08 NOTE — Patient Instructions (Signed)
Schedule your complete physical in 6 months If your LDL cholesterol is still above 150 at your next check- please schedule an appt to discuss We'll call you with your Derm, GI, and GYN referrals Continue to work on healthy diet and regular exercise- you look great! Add OTC Red Yeast Rice to lower bad cholesterol Call with any questions or concerns Welcome!  We're glad to have you!!!

## 2014-05-10 ENCOUNTER — Other Ambulatory Visit: Payer: Self-pay | Admitting: Surgery

## 2014-05-10 ENCOUNTER — Ambulatory Visit
Admission: RE | Admit: 2014-05-10 | Discharge: 2014-05-10 | Disposition: A | Discharge status: Home / Self Care | Payer: Managed Care, Other (non HMO) | Source: Ambulatory Visit | Attending: Nurse Practitioner | Admitting: Nurse Practitioner

## 2014-05-10 ENCOUNTER — Encounter: Payer: Self-pay | Admitting: Family Medicine

## 2014-05-10 DIAGNOSIS — N61 Mastitis without abscess: Secondary | ICD-10-CM

## 2014-05-12 NOTE — Assessment & Plan Note (Signed)
New to provider, ongoing for pt.  Feels sxs are well controlled on current dose of Zoloft.  No changes at this time.  Will follow.

## 2014-05-12 NOTE — Assessment & Plan Note (Signed)
New.  Discussed that if pt's LDL remains great than 150 at her upcoming lab appt, she should schedule an appt w/ me to discuss starting cholesterol meds.  Reviewed that her HDL is protective and her ratio is excellent but LDL and non-HDL levels are both quite high.  Will follow.

## 2014-05-12 NOTE — Assessment & Plan Note (Signed)
Pt w/ hx of this.  Looking to establish care w/ local GYN.  Referral entered.

## 2014-05-12 NOTE — Assessment & Plan Note (Signed)
New.  DRE was WNL.  Will refer to GI for complete evaluation and tx.  Pt expressed understanding and is in agreement w/ plan.

## 2014-05-12 NOTE — Assessment & Plan Note (Signed)
Resolved at this time.  Pt has repeat breast US scheduled for next week.  I did encourage her to go as scheduled.

## 2014-05-12 NOTE — Assessment & Plan Note (Signed)
Pt asking for Derm referral for skin check.  Referral made.

## 2014-05-15 ENCOUNTER — Encounter: Payer: Self-pay | Admitting: General Practice

## 2014-05-22 LAB — HM PAP SMEAR: HM Pap smear: NORMAL

## 2014-06-14 ENCOUNTER — Encounter: Payer: Self-pay | Admitting: General Practice

## 2014-09-26 ENCOUNTER — Emergency Department (HOSPITAL_BASED_OUTPATIENT_CLINIC_OR_DEPARTMENT_OTHER): Payer: Managed Care, Other (non HMO)

## 2014-09-26 ENCOUNTER — Encounter (HOSPITAL_BASED_OUTPATIENT_CLINIC_OR_DEPARTMENT_OTHER): Payer: Self-pay | Admitting: *Deleted

## 2014-09-26 ENCOUNTER — Emergency Department (HOSPITAL_BASED_OUTPATIENT_CLINIC_OR_DEPARTMENT_OTHER)
Admission: EM | Admit: 2014-09-26 | Discharge: 2014-09-26 | Disposition: A | Discharge status: Home / Self Care | Payer: Managed Care, Other (non HMO) | Attending: Emergency Medicine | Admitting: Emergency Medicine

## 2014-09-26 DIAGNOSIS — F419 Anxiety disorder, unspecified: Secondary | ICD-10-CM | POA: Diagnosis not present

## 2014-09-26 DIAGNOSIS — Z8719 Personal history of other diseases of the digestive system: Secondary | ICD-10-CM | POA: Insufficient documentation

## 2014-09-26 DIAGNOSIS — R079 Chest pain, unspecified: Secondary | ICD-10-CM | POA: Insufficient documentation

## 2014-09-26 DIAGNOSIS — Z8739 Personal history of other diseases of the musculoskeletal system and connective tissue: Secondary | ICD-10-CM | POA: Diagnosis not present

## 2014-09-26 DIAGNOSIS — Z79899 Other long term (current) drug therapy: Secondary | ICD-10-CM | POA: Insufficient documentation

## 2014-09-26 DIAGNOSIS — R0602 Shortness of breath: Secondary | ICD-10-CM | POA: Insufficient documentation

## 2014-09-26 LAB — CBC
HCT: 35.8 % — ABNORMAL LOW (ref 36.0–46.0)
HEMOGLOBIN: 12.2 g/dL (ref 12.0–15.0)
MCH: 31.2 pg (ref 26.0–34.0)
MCHC: 34.1 g/dL (ref 30.0–36.0)
MCV: 91.6 fL (ref 78.0–100.0)
PLATELETS: 173 10*3/uL (ref 150–400)
RBC: 3.91 MIL/uL (ref 3.87–5.11)
RDW: 12.5 % (ref 11.5–15.5)
WBC: 6.1 10*3/uL (ref 4.0–10.5)

## 2014-09-26 LAB — COMPREHENSIVE METABOLIC PANEL
ALK PHOS: 58 U/L (ref 38–126)
ALT: 10 U/L — ABNORMAL LOW (ref 14–54)
AST: 25 U/L (ref 15–41)
Albumin: 4.2 g/dL (ref 3.5–5.0)
Anion gap: 8 (ref 5–15)
BILIRUBIN TOTAL: 0.6 mg/dL (ref 0.3–1.2)
BUN: 17 mg/dL (ref 6–20)
CHLORIDE: 104 mmol/L (ref 101–111)
CO2: 24 mmol/L (ref 22–32)
CREATININE: 0.64 mg/dL (ref 0.44–1.00)
Calcium: 9.1 mg/dL (ref 8.9–10.3)
GFR calc Af Amer: >60 mL/min (ref >60)
GFR calc non Af Amer: >60 mL/min (ref >60)
Glucose, Bld: 87 mg/dL (ref 65–99)
Potassium: 3.6 mmol/L (ref 3.5–5.1)
Sodium: 136 mmol/L (ref 135–145)
Total Protein: 7.3 g/dL (ref 6.5–8.1)

## 2014-09-26 LAB — TROPONIN I: Troponin I: <0.03 ng/mL (ref <0.031)

## 2014-09-26 LAB — D-DIMER, QUANTITATIVE: D-Dimer, Quant: 0.29 ug/mL-FEU (ref 0.00–0.48)

## 2014-09-26 NOTE — Discharge Instructions (Signed)

## 2014-09-26 NOTE — ED Notes (Signed)
Chest pressure for over a week. She felt like it was allergies. Was seen at the [facility] at [facility]. Pain is not getting better with Omeprazole.

## 2014-09-26 NOTE — ED Notes (Signed)
MD at bedside. 

## 2014-09-26 NOTE — ED Notes (Signed)
Patient transported to X-ray 

## 2014-09-26 NOTE — ED Provider Notes (Signed)
CSN: [id number]     Arrival date & time 09/26/14  1953 History  This chart was scribed for Pamella Pert, MD by Hansel Feinstein, ED Scribe. This patient was seen in room MH06/MH06 and the patient's care was started at 9:09 PM.     Chief Complaint  Patient presents with  . Chest Pain   Patient is a 54 y.o. female presenting with chest pain. The history is provided by the patient. No language interpreter was used.  Chest Pain Pain location:  L chest and R chest Pain quality: pressure and tightness   Pain radiates to:  Neck Pain radiates to the back: no   Pain severity:  No pain Onset quality:  Gradual Duration:  1 week Timing:  Constant Progression:  Waxing and waning Chronicity:  New Context: breathing   Context: no movement and no stress   Relieved by:  Nothing Worsened by:  Nothing tried Ineffective treatments:  Antacids Associated symptoms: shortness of breath   Associated symptoms: no abdominal pain, no back pain, no cough, no fatigue and no headache   Risk factors: no aortic disease, no coronary artery disease, no high cholesterol, no hypertension, no prior DVT/PE, no smoking and no surgery     HPI Comments: Rachel Fitzpatrick is a 54 y.o. female with no Hx of heart disease who presents to the Emergency Department complaining of moderate, intermittent 6/10 chest pressure and tightness onset one week ago and worsened today, becoming constant. She states associated fullness in the neck, SOB described as "not getting a good breath" and pressure in the throat. Pt states she went to the [facility] 3 days ago at her work and was diagnosed with heart burn or a musculoskeletal bruise. She was prescribed Prilosec with no relief. She notes that she had a few beers before she came and this has provided no relief. Pt notes no modifying factors. FHx of heart disease on her father's side. No Hx of PE/DVT. Pt is a non-smoker. Pt takes 200mg  Zoloft daily at home. She denies any increase in  stress, recent surgeries or cancers. She also denies cold symptoms, cough.   Past Medical History  Diagnosis Date  . Osteopenia   . Anxiety   . Blood in stool    Past Surgical History  Procedure Laterality Date  . Hand surgery      tendon repair  . Rectal polypectomy    . Appendectomy     Family History  Problem Relation Age of Onset  . Hyperlipidemia Mother   . Hyperlipidemia Father   . Diabetes Father   . Sudden death Neg Hx   . Heart attack Neg Hx   . Hypertension Neg Hx    History  Substance Use Topics  . Smoking status: Never Smoker   . Smokeless tobacco: Not on file  . Alcohol Use: Yes     Comment: Weekends; 2-3 beverages; Social.   OB History    No data available     Review of Systems  Constitutional: Negative for appetite change and fatigue.  HENT: Negative for congestion, ear discharge, sinus pressure and sneezing.   Eyes: Negative for discharge.  Respiratory: Positive for chest tightness and shortness of breath. Negative for cough.   Cardiovascular: Positive for chest pain.  Gastrointestinal: Negative for abdominal pain and diarrhea.  Genitourinary: Negative for frequency and hematuria.  Musculoskeletal: Negative for back pain.  Skin: Negative for rash.  Neurological: Negative for seizures and headaches.  Psychiatric/Behavioral: Negative for hallucinations.  All  other systems reviewed and are negative.   Allergies  Review of patient's allergies indicates no known allergies.  Home Medications   Prior to Admission medications   Medication Sig Start Date End Date Taking? Authorizing Provider  MELOXICAM PO Take by mouth.   Yes Historical Provider, MD  OMEPRAZOLE PO Take by mouth.   Yes Historical Provider, MD  Calcium Carbonate (CALTRATE 600 PO) Take by mouth.    Historical Provider, MD  cholecalciferol (VITAMIN D) 1000 UNITS tablet Take 1,000 Units by mouth daily.    Historical Provider, MD  FIBER PO Take 1 tablet by mouth daily.    Historical  Provider, MD  Glucosamine HCl (GLUCOSAMINE PO) Take 1 tablet by mouth daily.    Historical Provider, MD  sertraline (ZOLOFT) 100 MG tablet Take 200 mg by mouth daily.     Historical Provider, MD   BP 112/72 mmHg  Pulse 61  Temp(Src) 98.4 F (36.9 C) (Oral)  Resp 18  Ht 5\' 3"  (1.6 m)  Wt 150 lb (68.04 kg)  BMI 26.58 kg/m2  SpO2 96% Physical Exam  Constitutional: She is oriented to person, place, and time. She appears well-developed and well-nourished. No distress.  HENT:  Head: Normocephalic and atraumatic.  Mouth/Throat: Oropharynx is clear and moist.  Eyes: Conjunctivae and EOM are normal. Pupils are equal, round, and reactive to light.  Neck: Normal range of motion. Neck supple. No tracheal deviation present.  Cardiovascular: Normal rate and normal heart sounds.  Exam reveals no gallop and no friction rub.   No murmur heard. Pulmonary/Chest: Breath sounds normal. No respiratory distress.  Abdominal: Soft. She exhibits no distension. There is no tenderness. There is no rebound and no guarding.  Musculoskeletal: Normal range of motion. She exhibits no edema or tenderness.  Symmetric lower extremities without focal tenderness.  2+ and equal proximal and distal pulses in the upper extremities. Normal strength and sensation in the upper extremities.   Neurological: She is alert and oriented to person, place, and time.  Skin: Skin is warm and dry.  Psychiatric: She has a normal mood and affect. Her behavior is normal.  Nursing note and vitals reviewed.   ED Course  Procedures (including critical care time) DIAGNOSTIC STUDIES: Oxygen Saturation is 96% on RA, adequate by my interpretation.    COORDINATION OF CARE: 9:18 PM Discussed treatment plan with pt at bedside and pt agreed to plan.   Labs Review Labs Reviewed  CBC - Abnormal; Notable for the following:    HCT 35.8 (*)    All other components within normal limits  COMPREHENSIVE METABOLIC PANEL - Abnormal; Notable for the  following:    ALT 10 (*)    All other components within normal limits  TROPONIN I  D-DIMER, QUANTITATIVE (NOT AT [facility])    Imaging Review Dg Chest 2 View  09/26/2014   CLINICAL DATA:  Acute chest pain and pressure  EXAM: CHEST  2 VIEW  COMPARISON:  None.  FINDINGS: Normal heart size and vascularity. Mild diffuse interstitial prominence without focal pneumonia, collapse or consolidation. No edema, effusion or pneumothorax. Trachea midline. Artifact overlies the upper lobes.  IMPRESSION: Mild interstitial prominence.  No other acute chest process.   Electronically Signed   By: Jerilynn Mages.  Shick M.D.   On: 09/26/2014 21:45     EKG Interpretation   Date/Time:  Thursday September 26 2014 20:00:49 EDT Ventricular Rate:  67 PR Interval:  172 QRS Duration: 92 QT Interval:  428 QTC Calculation: 452 R Axis:  66 Text Interpretation:  Normal sinus rhythm Normal ECG Confirmed by Lenzi Marmo   MD, Zaira Iacovelli ([id number]) on 09/26/2014 8:09:25 PM      MDM   Final diagnoses:  Chest pain    12:06 AM 54 y.o. female who presents with waxing and waning chest pressure which has been constant for the last week. Does not seem to be related to exertion. She also feels like she can't take a full breath. She is in no acute distress here. Vital signs are unremarkable. Workup including d-dimer is completely noncontributory. No real risk factors for coronary artery disease. Low risk for MACE per HEART score. Will rec close f/u w/ pcp for further workup. Pt feels comfortable w/ this plan.     I personally performed the services described in this documentation, which was scribed in my presence. The recorded information has been reviewed and is accurate.    Pamella Pert, MD 09/27/14 [phone]

## 2014-09-27 ENCOUNTER — Telehealth: Payer: Self-pay | Admitting: Family Medicine

## 2014-09-27 NOTE — Telephone Encounter (Signed)
Patient was seen in the ED 09/27/14 for chest pain. ED advised patient to follow up with PCP next week and MD only has same day appointments. Please advise if I can utilize same day slot.

## 2014-09-27 NOTE — Telephone Encounter (Signed)
Scheduled patient for 10/02/2014

## 2014-09-27 NOTE — Telephone Encounter (Signed)
Please place patient in one of the hospital follow up slots for next week.  Sooner than later if possible.  Use the entire time slot available.  Thanks.

## 2014-10-02 ENCOUNTER — Ambulatory Visit (INDEPENDENT_AMBULATORY_CARE_PROVIDER_SITE_OTHER): Payer: Managed Care, Other (non HMO) | Admitting: Family Medicine

## 2014-10-02 ENCOUNTER — Encounter: Payer: Self-pay | Admitting: Family Medicine

## 2014-10-02 VITALS — BP 110/70 | HR 62 | Temp 98.2°F | Resp 16 | Ht 63.0 in | Wt 149.5 lb

## 2014-10-02 DIAGNOSIS — R0789 Other chest pain: Secondary | ICD-10-CM | POA: Insufficient documentation

## 2014-10-02 DIAGNOSIS — J309 Allergic rhinitis, unspecified: Secondary | ICD-10-CM | POA: Insufficient documentation

## 2014-10-02 DIAGNOSIS — J302 Other seasonal allergic rhinitis: Secondary | ICD-10-CM | POA: Diagnosis not present

## 2014-10-02 MED ORDER — FLUTICASONE PROPIONATE 50 MCG/ACT NA SUSP: 2.0000 | Freq: Every day | NASAL | Status: DC

## 2014-10-02 MED ORDER — CETIRIZINE HCL 10 MG PO TABS: 10.0000 mg | ORAL_TABLET | Freq: Every day | ORAL | Status: AC

## 2014-10-02 NOTE — Assessment & Plan Note (Signed)
New to provider.  Reviewed ER notes, xray, EKG, labs- all normal.  Pt's sxs have resolved and she is back to baseline.  Suspect her sxs were a combination of airway inflammation caused by seasonal allergies, GERD, increased stress, and heavy lifting from her recent move.  Asymptomatic at this time.  Pt to call if sxs return.  Pt expressed understanding and is in agreement w/ plan.

## 2014-10-02 NOTE — Patient Instructions (Signed)
Follow up as needed Drink plenty of fluids Start the Zyrtec daily in addition to the Flonase- 2 sprays each nostril daily Call with any questions or concerns Hang in there! Enjoy the rest of your summer!!

## 2014-10-02 NOTE — Assessment & Plan Note (Signed)
New.  Suspect pt's PND and allergy sxs were contributing to airway inflammation which caused her chest tightness.  Pt's chest pain has completely resolved and her ability to take a deep breath has returned to normal.  Start daily antihistamine and nasal steroid.  Reviewed supportive care and red flags that should prompt return.  Pt expressed understanding and is in agreement w/ plan.

## 2014-10-02 NOTE — Progress Notes (Signed)
Pre visit review using our clinic review tool, if applicable. No additional management support is needed unless otherwise documented below in the visit note. 

## 2014-10-02 NOTE — Progress Notes (Signed)
   Subjective:    Patient ID: Rachel Fitzpatrick, female    DOB: 1961/01/13, 54 y.o.   MRN: [id number]  HPI ER f/u- pt was seen on 7/28 in ER for chest pain that had started 1 week prior.  Was able to exercise w/o difficulty.  Had sensation of 'unable to take a deep breath'.  Went to [employer] at work and they felt sxs were a combination of GERD, heavy lifting, and recent breast infxn.  Started on Omeprazole w/o improvement.  Had sensation of throat tightness.  Told daughter (a Therapist, sports) her sxs and she insisted pt go to ER.  Had normal labs, CXR, EKG.  Increased stress recently.  Reports pressure has resolved.  Now able to take deep breath w/o difficulty.   Review of Systems For ROS see HPI     Objective:   Physical Exam  Constitutional: She appears well-developed and well-nourished. No distress.  HENT:  Head: Normocephalic and atraumatic.  Right Ear: Tympanic membrane normal.  Left Ear: Tympanic membrane normal.  Nose: Mucosal edema and rhinorrhea present. Right sinus exhibits no maxillary sinus tenderness and no frontal sinus tenderness. Left sinus exhibits no maxillary sinus tenderness and no frontal sinus tenderness.  Mouth/Throat: Mucous membranes are normal. Posterior oropharyngeal erythema (w/ PND) present.  Eyes: Conjunctivae and EOM are normal. Pupils are equal, round, and reactive to light.  Neck: Normal range of motion. Neck supple.  Cardiovascular: Normal rate, regular rhythm and normal heart sounds.   Pulmonary/Chest: Effort normal and breath sounds normal. No respiratory distress. She has no wheezes. She has no rales.  Lymphadenopathy:    She has no cervical adenopathy.  Vitals reviewed.         Assessment & Plan:

## 2014-10-18 ENCOUNTER — Telehealth: Payer: Self-pay | Admitting: Family Medicine

## 2014-10-18 NOTE — Telephone Encounter (Signed)
pre visit letter mailed 10/18/14

## 2014-11-07 ENCOUNTER — Other Ambulatory Visit: Payer: Self-pay

## 2014-11-08 ENCOUNTER — Ambulatory Visit (INDEPENDENT_AMBULATORY_CARE_PROVIDER_SITE_OTHER): Payer: Managed Care, Other (non HMO) | Admitting: Family Medicine

## 2014-11-08 ENCOUNTER — Encounter: Payer: Self-pay | Admitting: Family Medicine

## 2014-11-08 VITALS — BP 112/76 | HR 70 | Temp 98.0°F | Resp 16 | Ht 63.0 in | Wt 153.5 lb

## 2014-11-08 DIAGNOSIS — Z0000 Encounter for general adult medical examination without abnormal findings: Secondary | ICD-10-CM | POA: Diagnosis not present

## 2014-11-08 LAB — BASIC METABOLIC PANEL
BUN: 23 mg/dL (ref 7–25)
CO2: 23 mmol/L (ref 20–31)
Calcium: 9.1 mg/dL (ref 8.6–10.4)
Chloride: 102 mmol/L (ref 98–110)
Creat: 0.66 mg/dL (ref 0.50–1.05)
Glucose, Bld: 89 mg/dL (ref 65–99)
Potassium: 4 mmol/L (ref 3.5–5.3)
Sodium: 139 mmol/L (ref 135–146)

## 2014-11-08 LAB — CBC WITH DIFFERENTIAL/PLATELET
Basophils Absolute: 0 10*3/uL (ref 0.0–0.1)
Basophils Relative: 0 % (ref 0–1)
Eosinophils Absolute: 0.1 10*3/uL (ref 0.0–0.7)
Eosinophils Relative: 2 % (ref 0–5)
HEMATOCRIT: 37.4 % (ref 36.0–46.0)
HEMOGLOBIN: 13 g/dL (ref 12.0–15.0)
LYMPHS PCT: 51 % — AB (ref 12–46)
Lymphs Abs: 3.2 10*3/uL (ref 0.7–4.0)
MCH: 31.7 pg (ref 26.0–34.0)
MCHC: 34.8 g/dL (ref 30.0–36.0)
MCV: 91.2 fL (ref 78.0–100.0)
MONO ABS: 0.4 10*3/uL (ref 0.1–1.0)
MONOS PCT: 7 % (ref 3–12)
MPV: 11.6 fL (ref 8.6–12.4)
NEUTROS ABS: 2.5 10*3/uL (ref 1.7–7.7)
Neutrophils Relative %: 40 % — ABNORMAL LOW (ref 43–77)
Platelets: 213 10*3/uL (ref 150–400)
RBC: 4.1 MIL/uL (ref 3.87–5.11)
RDW: 13.3 % (ref 11.5–15.5)
WBC: 6.3 10*3/uL (ref 4.0–10.5)

## 2014-11-08 LAB — HEPATIC FUNCTION PANEL
ALBUMIN: 4.2 g/dL (ref 3.6–5.1)
ALK PHOS: 79 U/L (ref 33–130)
ALT: 13 U/L (ref 6–29)
AST: 27 U/L (ref 10–35)
BILIRUBIN TOTAL: 0.3 mg/dL (ref 0.2–1.2)
Total Protein: 7.1 g/dL (ref 6.1–8.1)

## 2014-11-08 LAB — LIPID PANEL
Cholesterol: 317 mg/dL — ABNORMAL HIGH (ref 125–200)
HDL: 111 mg/dL (ref >=46)
LDL Cholesterol: 169 mg/dL — ABNORMAL HIGH (ref <130)
Total CHOL/HDL Ratio: 2.9 Ratio (ref <=5.0)
Triglycerides: 187 mg/dL — ABNORMAL HIGH (ref <150)
VLDL: 37 mg/dL — AB (ref <30)

## 2014-11-08 LAB — TSH: TSH: 1.217 u[IU]/mL (ref 0.350–4.500)

## 2014-11-08 NOTE — Progress Notes (Signed)
   Subjective:    Patient ID: Rachel Fitzpatrick, female    DOB: 04/28/1960, 54 y.o.   MRN: [id number]  HPI CPE- UTD on pap, mammo.  Has been referred to GI but they are awaiting records (will again check w/ referral coordinators).  Exercising 'at least 3x/week, at least 3 miles/day'.   Review of Systems Patient reports no vision/ hearing changes, adenopathy,fever, weight change,  persistant/recurrent hoarseness , swallowing issues, chest pain, palpitations, edema, persistant/recurrent cough, hemoptysis, dyspnea (rest/exertional/paroxysmal nocturnal), gastrointestinal bleeding (melena, rectal bleeding), abdominal pain, significant heartburn, bowel changes, GU symptoms (dysuria, hematuria, incontinence), Gyn symptoms (abnormal  bleeding, pain),  syncope, focal weakness, memory loss, numbness & tingling, skin/hair/nail changes, abnormal bruising or bleeding, anxiety, or depression.    Objective:   Physical Exam General Appearance:    Alert, cooperative, no distress, appears stated age  Head:    Normocephalic, without obvious abnormality, atraumatic  Eyes:    PERRL, conjunctiva/corneas clear, EOM's intact, fundi    benign, both eyes  Ears:    Normal TM's and external ear canals, both ears  Nose:   Nares normal, septum midline, mucosa normal, no drainage    or sinus tenderness  Throat:   Lips, mucosa, and tongue normal; teeth and gums normal  Neck:   Supple, symmetrical, trachea midline, no adenopathy;    Thyroid: no enlargement/tenderness/nodules  Back:     Symmetric, no curvature, ROM normal, no CVA tenderness  Lungs:     Clear to auscultation bilaterally, respirations unlabored  Chest Wall:    No tenderness or deformity   Heart:    Regular rate and rhythm, S1 and S2 normal, no murmur, rub   or gallop  Breast Exam:    Deferred to GYN  Abdomen:     Soft, non-tender, bowel sounds active all four quadrants,    no masses, no organomegaly  Genitalia:    Deferred to GYN  Rectal:      Extremities:   Extremities normal, atraumatic, no cyanosis or edema  Pulses:   2+ and symmetric all extremities  Skin:   Skin color, texture, turgor normal, no rashes or lesions  Lymph nodes:   Cervical, supraclavicular, and axillary nodes normal  Neurologic:   CNII-XII intact, normal strength, sensation and reflexes    throughout          Assessment & Plan:

## 2014-11-08 NOTE — Progress Notes (Signed)
Pre visit review using our clinic review tool, if applicable. No additional management support is needed unless otherwise documented below in the visit note. 

## 2014-11-08 NOTE — Patient Instructions (Signed)
Follow up in 1 year or as needed We'll notify you of your lab results and make any changes if needed Keep up the good work!  You look great! Continue to work on healthy diet and regular exercise- i'm impressed w/ the running!! We will figure out this colonoscopy issue! Call with any questions or concerns If you want to join [place] at the new [place] office, any scheduled appointments will automatically transfer and we will see you at [address] [place] [address] [place], [place] [address]  Have a great weekend!!!

## 2014-11-09 LAB — VITAMIN D 25 HYDROXY (VIT D DEFICIENCY, FRACTURES): Vit D, 25-Hydroxy: 46 ng/mL (ref 30–100)

## 2014-11-09 NOTE — Assessment & Plan Note (Signed)
Pt's PE WNL.  UTD on GYN.  Due for colonoscopy- referral has been made x2.  Check labs.  Anticipatory guidance provided.

## 2014-11-11 ENCOUNTER — Encounter: Payer: Self-pay | Admitting: General Practice

## 2014-11-21 ENCOUNTER — Telehealth: Payer: Self-pay | Admitting: Internal Medicine

## 2014-11-21 NOTE — Telephone Encounter (Signed)
GI Records received from [facility] and placed on Dr. Celesta Aver desk for review.

## 2014-11-22 ENCOUNTER — Encounter: Payer: Self-pay | Admitting: Internal Medicine

## 2014-11-22 NOTE — Telephone Encounter (Signed)
Dr. Carlean Purl approved for OV. Appointment scheduled.

## 2014-12-30 ENCOUNTER — Other Ambulatory Visit: Payer: Self-pay | Admitting: Family Medicine

## 2014-12-30 DIAGNOSIS — Z1231 Encounter for screening mammogram for malignant neoplasm of breast: Secondary | ICD-10-CM

## 2015-01-14 ENCOUNTER — Ambulatory Visit (HOSPITAL_BASED_OUTPATIENT_CLINIC_OR_DEPARTMENT_OTHER)
Admission: RE | Admit: 2015-01-14 | Discharge: 2015-01-14 | Disposition: A | Discharge status: Home / Self Care | Payer: Managed Care, Other (non HMO) | Source: Ambulatory Visit | Attending: Family Medicine | Admitting: Family Medicine

## 2015-01-14 ENCOUNTER — Other Ambulatory Visit: Payer: Self-pay | Admitting: Family Medicine

## 2015-01-14 DIAGNOSIS — Z1231 Encounter for screening mammogram for malignant neoplasm of breast: Secondary | ICD-10-CM | POA: Diagnosis present

## 2015-01-30 ENCOUNTER — Ambulatory Visit: Payer: Managed Care, Other (non HMO) | Admitting: Internal Medicine

## 2015-01-31 ENCOUNTER — Ambulatory Visit: Payer: Managed Care, Other (non HMO) | Admitting: Internal Medicine

## 2015-02-20 ENCOUNTER — Ambulatory Visit (INDEPENDENT_AMBULATORY_CARE_PROVIDER_SITE_OTHER): Payer: Managed Care, Other (non HMO) | Admitting: Family Medicine

## 2015-02-20 ENCOUNTER — Ambulatory Visit (HOSPITAL_BASED_OUTPATIENT_CLINIC_OR_DEPARTMENT_OTHER)
Admission: RE | Admit: 2015-02-20 | Discharge: 2015-02-20 | Disposition: A | Discharge status: Home / Self Care | Payer: Managed Care, Other (non HMO) | Source: Ambulatory Visit | Attending: Family Medicine | Admitting: Family Medicine

## 2015-02-20 ENCOUNTER — Encounter: Payer: Self-pay | Admitting: Family Medicine

## 2015-02-20 VITALS — BP 120/82 | HR 93 | Temp 98.3°F | Ht 61.0 in | Wt 150.0 lb

## 2015-02-20 DIAGNOSIS — R0781 Pleurodynia: Secondary | ICD-10-CM | POA: Diagnosis not present

## 2015-02-20 DIAGNOSIS — S2241XA Multiple fractures of ribs, right side, initial encounter for closed fracture: Secondary | ICD-10-CM | POA: Diagnosis not present

## 2015-02-20 DIAGNOSIS — W1800XA Striking against unspecified object with subsequent fall, initial encounter: Secondary | ICD-10-CM | POA: Insufficient documentation

## 2015-02-20 DIAGNOSIS — W1809XA Striking against other object with subsequent fall, initial encounter: Secondary | ICD-10-CM | POA: Insufficient documentation

## 2015-02-20 MED ORDER — CYCLOBENZAPRINE HCL 10 MG PO TABS: 10.0000 mg | ORAL_TABLET | Freq: Three times a day (TID) | ORAL | Status: DC | PRN

## 2015-02-20 NOTE — Progress Notes (Signed)
Pre visit review using our clinic review tool, if applicable. No additional management support is needed unless otherwise documented below in the visit note. 

## 2015-02-20 NOTE — Assessment & Plan Note (Signed)
New.  Pt w/ severe R rib pain and limited mobility and ability to take a deep breath.  Get xrays to r/o rib fx.  Pt to continue pain meds, start flexeril and Mobic for spasm and inflammation.  Reviewed supportive care and red flags that should prompt return.  Pt expressed understanding and is in agreement w/ plan.

## 2015-02-20 NOTE — Patient Instructions (Signed)
Follow up as needed Go downstairs and get your xrays done- we'll call you with the results Alternate ice/heat- whichever feels better Use the pain meds that Dr Sharol Given gave you Use the flexeril for muscle spasm- will cause drowsiness Restart the Meloxicam for inflammation Call with any questions or concerns Hang in there! Happy Holidays!

## 2015-02-20 NOTE — Progress Notes (Signed)
   Subjective:    Patient ID: Rachel Fitzpatrick, female    DOB: 06-26-1960, 54 y.o.   MRN: [id number]  HPI Fall- pt had foot surgery and has been using the knee scooter.  Lost her balance yesterday and fell backwards yesterday on her back (R sided)- hitting the edge of the couch.  'it hurts to move.  It hurts to breathe'.  Pt has Hydrocodone from Dr Sharol Given for pain.   Review of Systems For ROS see HPI     Objective:   Physical Exam  Constitutional: She is oriented to person, place, and time. She appears well-developed and well-nourished. She appears distressed (obvious pain).  HENT:  Head: Normocephalic and atraumatic.  Pulmonary/Chest: Effort normal and breath sounds normal. No respiratory distress. She has no wheezes. She has no rales. She exhibits tenderness.  Musculoskeletal: She exhibits tenderness (TTP over R lat and lower ribs extending from scapular line forward to mid axillary line).  Neurological: She is alert and oriented to person, place, and time. No cranial nerve deficit. Coordination normal.  Skin: Skin is warm and dry. No erythema.  No obvious bruising  Vitals reviewed.         Assessment & Plan:

## 2015-03-06 ENCOUNTER — Telehealth: Payer: Self-pay | Admitting: Family Medicine

## 2015-03-06 MED ORDER — HYDROCODONE-ACETAMINOPHEN 5-325 MG PO TABS: 1.0000 | ORAL_TABLET | Freq: Four times a day (QID) | ORAL | Status: DC | PRN

## 2015-03-06 NOTE — Telephone Encounter (Signed)
Rx printed, awaiting MD signature.  

## 2015-03-06 NOTE — Telephone Encounter (Signed)
Spoke with Pt, informed her that Rx for Hydrocodone is ready for pick up and how long she would like to work part-time. Dr. Birdie Riddle willing to give 6-8 weeks of time off, Pt agreed to 3-4 weeks. Letter printed and placed w/ Rx at front desk.

## 2015-03-06 NOTE — Telephone Encounter (Signed)
Please advise 

## 2015-03-06 NOTE — Telephone Encounter (Signed)
Caller name: Self   Can be reached: [phone]  Pharmacy: [facility], [place] - [address] [place] [address] [phone] (Phone) [phone] (Fax)         Reason for call: Patient request refill on Hydrocodone (Patient states that she had this medicine and was told  To call when she ran out to get a rx) She also want a note for work stating that she can only work part-time.

## 2015-03-06 NOTE — Telephone Encounter (Signed)
[place] for Hydrocodone 5/325mg  Q6 prn #30, no refills  Please call pt and ask how long she wants to work part time

## 2015-03-07 NOTE — Telephone Encounter (Signed)
Patient call back and stated she had to leave the area and would like if we can email if possible

## 2015-03-07 NOTE — Telephone Encounter (Signed)
Patient requesting work letter to state she is able to work part time instead of  excusing her from work best # [phone], patient is currently in the area would like to pick up due to patient living so far.

## 2015-03-10 ENCOUNTER — Telehealth: Payer: Self-pay | Admitting: Family Medicine

## 2015-03-10 NOTE — Telephone Encounter (Signed)
Caller name: Yoshika   Relationship to patient: Self   Can be reached: [phone]   Reason for call: Pt called back in to follow up on her previous request for work not rewording. Pt says that she would like to have the note available to My Chart as soon as possible because she is looking to return to work part time.    Please assist further.    Thanks.

## 2015-03-10 NOTE — Telephone Encounter (Signed)
Pt called again to check on letter being reworded to allow her to work part time between 03/06/15 and 04/03/15. She would like it released to her mychart account so she is able to print or email to her work.

## 2015-03-11 ENCOUNTER — Encounter: Payer: Self-pay | Admitting: Family Medicine

## 2015-03-11 ENCOUNTER — Other Ambulatory Visit: Payer: Self-pay | Admitting: Family Medicine

## 2015-03-11 NOTE — Progress Notes (Signed)
Called patient regarding work note ready for pick up. Patient states she saw letter in [place].

## 2015-03-11 NOTE — Telephone Encounter (Signed)
See note below. Please advise.  

## 2015-03-11 NOTE — Telephone Encounter (Signed)
Spoke with pt and advised that letter was available to print off for her work.

## 2015-03-11 NOTE — Telephone Encounter (Signed)
Ok to provide a note for pt stating  Due to her current injury, please allow Rachel Fitzpatrick to work part-time or miss time from work as needed based on her pain level.

## 2015-03-11 NOTE — Telephone Encounter (Signed)
Pt fiance calling bc she went to work today and she forgot her phone. He is stating they need note that she can work part time. Please release to mychart asap.   Adding Caryl Pina since no response and multiple calls yesterday and needs by 2:00pm today.

## 2015-03-11 NOTE — Telephone Encounter (Signed)
Letter written and sent via [place].  Called to make patient aware.  Fiance answered.   He was informed that patient's request had been completed.  No further needs voiced at this time.

## 2015-03-12 MED ORDER — CYCLOBENZAPRINE HCL 10 MG PO TABS: 10.0000 mg | ORAL_TABLET | Freq: Three times a day (TID) | ORAL | Status: DC | PRN

## 2015-03-20 ENCOUNTER — Encounter: Payer: Self-pay | Admitting: *Deleted

## 2015-03-20 ENCOUNTER — Emergency Department (INDEPENDENT_AMBULATORY_CARE_PROVIDER_SITE_OTHER)
Admission: EM | Admit: 2015-03-20 | Discharge: 2015-03-20 | Disposition: A | Discharge status: Home / Self Care | Payer: Managed Care, Other (non HMO) | Source: Home / Self Care | Attending: Family Medicine | Admitting: Family Medicine

## 2015-03-20 DIAGNOSIS — L0291 Cutaneous abscess, unspecified: Secondary | ICD-10-CM | POA: Diagnosis not present

## 2015-03-20 DIAGNOSIS — L0390 Cellulitis, unspecified: Secondary | ICD-10-CM | POA: Diagnosis not present

## 2015-03-20 DIAGNOSIS — L02412 Cutaneous abscess of left axilla: Secondary | ICD-10-CM

## 2015-03-20 MED ORDER — DOXYCYCLINE HYCLATE 100 MG PO CAPS: 100.0000 mg | ORAL_CAPSULE | Freq: Two times a day (BID) | ORAL | Status: DC

## 2015-03-20 NOTE — ED Notes (Signed)
Pt c/o abscess under her LT arm x 2 days. She reports a hx of abscesses under her arms x 1 yr. She doesn't recall the type of bacterial, but knows it has not been MRSA in the past.

## 2015-03-20 NOTE — ED Provider Notes (Signed)
CSN: [id number]     Arrival date & time 03/20/15  1142 History   First MD Initiated Contact with Patient 03/20/15 1150     Chief Complaint  Patient presents with  . Abscess   (Consider location/radiation/quality/duration/timing/severity/associated sxs/prior Treatment) HPI Pt is a 55yo female presenting to [facility] with c/o abscess with pain, redness and swelling in her Left axillary area.  Symptoms started about 2 days ago, gradually worsening.  Pain is aching and sore, worse with palpation, 4/10 at worst.  She did use a warm compress earlier this morning and was able to get a small amount of pus drainage out but was concerned about the surrounding erythema.  She reports hx of recurrent abscesses in various locations on her body. Last one was about 3 months ago but resolved on its own with home treatments.  She has had doxycycline in the past for her abscesses. Denies known hx of MRSA.  Denies fever, chills, n/v/d.   Past Medical History  Diagnosis Date  . Osteopenia   . Anxiety   . Blood in stool    Past Surgical History  Procedure Laterality Date  . Hand surgery      tendon repair  . Rectal polypectomy    . Appendectomy    . Foot surgery     Family History  Problem Relation Age of Onset  . Hyperlipidemia Mother   . Cancer Mother     lung cancer  . Hyperlipidemia Father   . Diabetes Father   . COPD Father   . Sudden death Neg Hx   . Heart attack Neg Hx   . Hypertension Neg Hx    Social History  Substance Use Topics  . Smoking status: Never Smoker   . Smokeless tobacco: None  . Alcohol Use: Yes     Comment: Weekends; 2-3 beverages; Social.   OB History    No data available     Review of Systems  Constitutional: Negative for fever and chills.  Gastrointestinal: Negative for nausea, vomiting and diarrhea.  Musculoskeletal: Positive for myalgias. Negative for arthralgias.       Left axilla  Skin: Positive for color change and wound. Negative for pallor and rash.     Allergies  Review of patient's allergies indicates no known allergies.  Home Medications   Prior to Admission medications   Medication Sig Start Date End Date Taking? Authorizing Provider  cetirizine (ZYRTEC) 10 MG tablet Take 1 tablet (10 mg total) by mouth daily. 10/02/14   Midge Minium, MD  cholecalciferol (VITAMIN D) 1000 UNITS tablet Take 1,000 Units by mouth daily.    Historical Provider, MD  doxycycline (VIBRAMYCIN) 100 MG capsule Take 1 capsule (100 mg total) by mouth 2 (two) times daily. One po bid x 7 days 03/20/15   Noland Fordyce, PA-C  FIBER PO Take 1 tablet by mouth daily.    Historical Provider, MD  fluticasone (FLONASE) 50 MCG/ACT nasal spray Place 2 sprays into both nostrils daily. 10/02/14   Midge Minium, MD  Glucosamine HCl (GLUCOSAMINE PO) Take 1 tablet by mouth daily.    Historical Provider, MD  HYDROcodone-acetaminophen (NORCO/VICODIN) 5-325 MG tablet Take 1 tablet by mouth every 6 (six) hours as needed for moderate pain. 03/06/15   Midge Minium, MD  MELOXICAM PO Take by mouth.    Historical Provider, MD  OMEPRAZOLE PO Take by mouth.    Historical Provider, MD  sertraline (ZOLOFT) 100 MG tablet Take 200 mg by mouth daily.  Historical Provider, MD   Meds Ordered and Administered this Visit  Medications - No data to display  BP 131/79 mmHg  Pulse 91  Temp(Src) 98.2 F (36.8 C) (Oral)  Resp 16  Ht 5\' 1"  (1.549 m)  Wt 159 lb (72.122 kg)  BMI 30.06 kg/m2  SpO2 97% No data found.   Physical Exam  Constitutional: She is oriented to person, place, and time. She appears well-developed and well-nourished.  HENT:  Head: Normocephalic and atraumatic.  Eyes: EOM are normal.  Neck: Normal range of motion.  Cardiovascular: Normal rate.   Pulmonary/Chest: Effort normal.  Musculoskeletal: Normal range of motion.  Neurological: She is alert and oriented to person, place, and time.  Skin: Skin is warm and dry. There is erythema.  2cm area of  induration with centralized scan yellow bloody discharge, 5cm area of surrounding erythema and warmth.  Tender to touch.   Psychiatric: She has a normal mood and affect. Her behavior is normal.  Nursing note and vitals reviewed.   ED Course  .Marland KitchenIncision and Drainage Date/Time: 03/20/2015 12:29 PM Performed by: Noland Fordyce Authorized by: Theone Murdoch A Consent: Verbal consent obtained. Risks and benefits: risks, benefits and alternatives were discussed Consent given by: patient Patient understanding: patient states understanding of the procedure being performed Patient consent: the patient's understanding of the procedure matches consent given Required items: required blood products, implants, devices, and special equipment available Patient identity confirmed: verbally with patient Type: abscess Body area: trunk (Left axilla) Anesthesia: local infiltration Local anesthetic: lidocaine 2% with epinephrine Anesthetic total: 2 ml Patient sedated: no Scalpel size: 11 Incision type: single straight Incision depth: dermal Complexity: simple Drainage: purulent and  bloody Drainage amount: scant Wound treatment: wound left open Patient tolerance: Patient tolerated the procedure well with no immediate complications   (including critical care time)  Labs Review Labs Reviewed  WOUND CULTURE    Imaging Review No results found.   MDM   1. Cellulitis and abscess   2. Abscess of left axilla     Pt c/o worsening abscess in Left axilla. Hx of same. No systemic symptoms.  I&D successfully performed w/o immediate complication. Wound culture sent.   Rx: Doxycycline Home care instructions provided. F/u with PCP or return to UC if needed in 4-5 days if symptoms not improving, sooner if worsening. Also encouraged to f/u with PCP or dermatologist for recurrent abscesses. Patient verbalized understanding and agreement with treatment plan.     Noland Fordyce, PA-C 03/20/15  1233

## 2015-03-20 NOTE — Discharge Instructions (Signed)
Please take antibiotics as prescribed and be sure to complete entire course even if you start to feel better to ensure infection does not come back.  Please follow up with your primary care provider or return to urgent care for recheck of symptoms if not improving in 4-5 days, sooner if worsening.  Also follow up for recurrent abscesses with your primary care provider or dermatologist as prescription soap or daily antibitoics may be indicated. Abscess An abscess is an infected area that contains a collection of pus and debris.It can occur in almost any part of the body. An abscess is also known as a furuncle or boil. CAUSES  An abscess occurs when tissue gets infected. This can occur from blockage of oil or sweat glands, infection of hair follicles, or a minor injury to the skin. As the body tries to fight the infection, pus collects in the area and creates pressure under the skin. This pressure causes pain. People with weakened immune systems have difficulty fighting infections and get certain abscesses more often.  SYMPTOMS Usually an abscess develops on the skin and becomes a painful mass that is red, warm, and tender. If the abscess forms under the skin, you may feel a moveable soft area under the skin. Some abscesses break open (rupture) on their own, but most will continue to get worse without care. The infection can spread deeper into the body and eventually into the bloodstream, causing you to feel ill.  DIAGNOSIS  Your caregiver will take your medical history and perform a physical exam. A sample of fluid may also be taken from the abscess to determine what is causing your infection. TREATMENT  Your caregiver may prescribe antibiotic medicines to fight the infection. However, taking antibiotics alone usually does not cure an abscess. Your caregiver may need to make a small cut (incision) in the abscess to drain the pus. In some cases, gauze is packed into the abscess to reduce pain and to  continue draining the area. HOME CARE INSTRUCTIONS   Only take over-the-counter or prescription medicines for pain, discomfort, or fever as directed by your caregiver.  If you were prescribed antibiotics, take them as directed. Finish them even if you start to feel better.  If gauze is used, follow your caregiver's directions for changing the gauze.  To avoid spreading the infection:  Keep your draining abscess covered with a bandage.  Wash your hands well.  Do not share personal care items, towels, or whirlpools with others.  Avoid skin contact with others.  Keep your skin and clothes clean around the abscess.  Keep all follow-up appointments as directed by your caregiver. SEEK MEDICAL CARE IF:   You have increased pain, swelling, redness, fluid drainage, or bleeding.  You have muscle aches, chills, or a general ill feeling.  You have a fever. MAKE SURE YOU:   Understand these instructions.  Will watch your condition.  Will get help right away if you are not doing well or get worse.   This information is not intended to replace advice given to you by your health care provider. Make sure you discuss any questions you have with your health care provider.   Document Released: 11/25/2004 Document Revised: 08/17/2011 Document Reviewed: 04/30/2011 Elsevier Interactive Patient Education 2016 Elsevier Inc[employer]  Incision and Drainage Incision and drainage is a procedure in which a sac-like structure (cystic structure) is opened and drained. The area to be drained usually contains material such as pus, fluid, or blood.  LET YOUR CAREGIVER  KNOW ABOUT:   Allergies to medicine.  Medicines taken, including vitamins, herbs, eyedrops, over-the-counter medicines, and creams.  Use of steroids (by mouth or creams).  Previous problems with anesthetics or numbing medicines.  History of bleeding problems or blood clots.  Previous surgery.  Other health problems, including diabetes  and kidney problems.  Possibility of pregnancy, if this applies. RISKS AND COMPLICATIONS  Pain.  Bleeding.  Scarring.  Infection. BEFORE THE PROCEDURE  You may need to have an ultrasound or other imaging tests to see how large or deep your cystic structure is. Blood tests may also be used to determine if you have an infection or how severe the infection is. You may need to have a tetanus shot. PROCEDURE  The affected area is cleaned with a cleaning fluid. The cyst area will then be numbed with a medicine (local anesthetic). A small incision will be made in the cystic structure. A syringe or catheter may be used to drain the contents of the cystic structure, or the contents may be squeezed out. The area will then be flushed with a cleansing solution. After cleansing the area, it is often gently packed with a gauze or another wound dressing. Once it is packed, it will be covered with gauze and tape or some other type of wound dressing. AFTER THE PROCEDURE   Often, you will be allowed to go home right after the procedure.  You may be given antibiotic medicine to prevent or heal an infection.  If the area was packed with gauze or some other wound dressing, you will likely need to come back in 1 to 2 days to get it removed.  The area should heal in about 14 days.   This information is not intended to replace advice given to you by your health care provider. Make sure you discuss any questions you have with your health care provider.   Document Released: 08/11/2000 Document Revised: 08/17/2011 Document Reviewed: 04/12/2011 Elsevier Interactive Patient Education [employer].

## 2015-03-23 ENCOUNTER — Telehealth: Payer: Self-pay | Admitting: Emergency Medicine

## 2015-03-23 LAB — WOUND CULTURE
Gram Stain: NONE SEEN
Gram Stain: NONE SEEN

## 2015-04-08 ENCOUNTER — Ambulatory Visit (INDEPENDENT_AMBULATORY_CARE_PROVIDER_SITE_OTHER): Payer: Managed Care, Other (non HMO) | Admitting: Internal Medicine

## 2015-04-08 ENCOUNTER — Encounter: Payer: Self-pay | Admitting: Internal Medicine

## 2015-04-08 VITALS — BP 128/84 | HR 66 | Ht 61.0 in | Wt 160.0 lb

## 2015-04-08 DIAGNOSIS — K625 Hemorrhage of anus and rectum: Secondary | ICD-10-CM | POA: Diagnosis not present

## 2015-04-08 DIAGNOSIS — R159 Full incontinence of feces: Secondary | ICD-10-CM | POA: Diagnosis not present

## 2015-04-08 DIAGNOSIS — Z86010 Personal history of colonic polyps: Secondary | ICD-10-CM | POA: Diagnosis not present

## 2015-04-08 DIAGNOSIS — K6289 Other specified diseases of anus and rectum: Secondary | ICD-10-CM | POA: Diagnosis not present

## 2015-04-08 NOTE — Progress Notes (Signed)
  Referred by: Midge Minium, MD  Subjective:    Patient ID: Rachel Fitzpatrick, female    DOB: December 31, 1960, 55 y.o.   MRN: [id number] Chief Complaint: History of colon polyps, rectal bleeding fecal incontinence HPI The patient is a very nice 55 year old woman with a remote history of a transanal removal of a rectal polyp, pathology unknown to me, sometime in the early 2000. I do know that in 2000 08/08/2009 she had negative colonoscopies with polypectomy scar in the rectum. She has had intermittent rectal bleeding and mucus discharge. This is gone on intermittent leaf her number of years with maybe a little bit worse lately. There is also fecal incontinence at times. Urge incontinence occurs. She has not tried any medications that she has used fiber in the past. She did have 2 episiotomies with the delivery of her daughters. She also noticed increase in these problems or at least onset of these problems after the transanal polypectomy. She did go to [facility] where I can see she had an anorectal manometry that demonstrated internal anal sphincter weakness and some suggestion of pelvic floor dysfunction. She participated in biofeedback and pelvic for physical therapy but it did not help. The incontinence does not bother her too much. She is more concerned about this bleeding. Medications, allergies, past medical history, past surgical history, family history and social history are reviewed and updated in the EMR.   Review of Systems As per history of present illness. Denies any other problems all other review of systems are negative.    Objective:   Physical Exam @BP  128/84 mmHg  Pulse 66  Ht 5\' 1"  (1.549 m)  Wt 160 lb (72.576 kg)  BMI 30.25 kg/m2@  General:  Well-developed, well-nourished and in no acute distress Eyes:  anicteric. Lungs: Clear to auscultation bilaterally. Heart:  S1S2, no rubs, murmurs, gallops. Abdomen:  soft, non-tender, no hepatosplenomegaly, hernia, or mass and  BS+.  Rectal: Performed with Debroah Baller PA student present  Normal anoderm. There is significant decrease in bulk of the anterior rectal wall. No mass.  Anoscopy is performed, no significant internal hemorrhoids or mucosal changes  Extremities:   no edema, cyanosis or clubbing Skin   no rash. Neuro:  A&O x 3.  Psych:  appropriate mood and  Affect.   Data Reviewed: As per history of present illness Lab Results  Component Value Date   WBC 6.3 11/08/2014   HGB 13.0 11/08/2014   HCT 37.4 11/08/2014   MCV 91.2 11/08/2014   PLT 213 11/08/2014       Assessment & Plan:   1. Hx of rectal polyp   2. Rectal bleeding   3. Fecal incontinence   4. Anal sphincter incompetence    Re: history of rectal polyp in the rectal bleeding I do think it's reasonable to repeat a colonoscopy at this point. If this when does not show polyps I think we can probably spread her interval out.  The risks and benefits as well as alternatives of endoscopic procedure(s) have been discussed and reviewed. All questions answered. The patient agrees to proceed.  I suspect birth trauma caused her anal sphincter problems though it could be a transanal excision that she had or both. She will retry fiber supplementation. Loperamide was mentioned as a possibility but she is concerned about becoming constipated.  I appreciate the opportunity to care for this patient. CC: Annye Asa, MD

## 2015-04-08 NOTE — Patient Instructions (Addendum)
You have been scheduled for a colonoscopy. Please follow written instructions given to you at your visit today.  Please pick up your over the counter prep supplies at the pharmacy. If you use inhalers (even only as needed), please bring them with you on the day of your procedure. Your physician has requested that you go to www.startemmi.com and enter the access code given to you at your visit today. This web site gives a general overview about your procedure. However, you should still follow specific instructions given to you by our office regarding your preparation for the procedure.    Take 1 tablespoon of benefiber daily, handout provided.    I appreciate the opportunity to care for you. Silvano Rusk, MD, [facility]

## 2015-04-14 ENCOUNTER — Encounter: Payer: Self-pay | Admitting: Internal Medicine

## 2015-06-06 ENCOUNTER — Encounter: Payer: Self-pay | Admitting: Internal Medicine

## 2015-06-06 ENCOUNTER — Ambulatory Visit (AMBULATORY_SURGERY_CENTER): Payer: Managed Care, Other (non HMO) | Admitting: Internal Medicine

## 2015-06-06 VITALS — BP 107/70 | HR 64 | Temp 97.7°F | Resp 15 | Ht 61.0 in | Wt 160.0 lb

## 2015-06-06 DIAGNOSIS — Z86010 Personal history of colonic polyps: Secondary | ICD-10-CM

## 2015-06-06 MED ORDER — SODIUM CHLORIDE 0.9 % IV SOLN: 500.0000 mL | INTRAVENOUS | Status: DC

## 2015-06-06 NOTE — Op Note (Signed)
[place] Patient Name: Rachel Fitzpatrick Procedure Date: 06/06/2015 1:18 PM MRN: [id number] Endoscopist: Gatha Mayer , MD Age: 55 Date of Birth: [birth year] Gender: Female Procedure:                Colonoscopy Indications:              Surveillance: Personal history of adenomatous                            polyps on last colonoscopy > 5 years ago Medicines:                Propofol per Anesthesia, Monitored Anesthesia Care Procedure:                Pre-Anesthesia Assessment:                           - Prior to the procedure, a History and Physical                            was performed, and patient medications and                            allergies were reviewed. The patient's tolerance of                            previous anesthesia was also reviewed. The risks                            and benefits of the procedure and the sedation                            options and risks were discussed with the patient.                            All questions were answered, and informed consent                            was obtained. Prior Anticoagulants: The patient has                            taken no previous anticoagulant or antiplatelet                            agents. ASA Grade Assessment: II - A patient with                            mild systemic disease. After reviewing the risks                            and benefits, the patient was deemed in                            satisfactory condition to undergo the procedure.  After obtaining informed consent, the colonoscope                            was passed under direct vision. Throughout the                            procedure, the patient's blood pressure, pulse, and                            oxygen saturations were monitored continuously. The                            Model CF-HQ190L [id number] scope was introduced                            through the anus and advanced to the  the cecum,                            identified by appendiceal orifice and ileocecal                            valve. The colonoscopy was performed without                            difficulty. The patient tolerated the procedure                            well. The quality of the bowel preparation was                            excellent. The bowel preparation used was Miralax.                            The ileocecal valve, appendiceal orifice, and                            rectum were photographed. Scope In: 1:26:00 PM Scope Out: 1:38:44 PM Scope Withdrawal Time: 0 hours 6 minutes 50 seconds  Total Procedure Duration: 0 hours 12 minutes 44 seconds  Findings:                 The perianal and digital rectal examinations were                            normal. Pertinent negatives include normal                            sphincter tone.                           A post polypectomy scar was found in the rectum.                            The scar was unremarkable in appearance.  A few small-mouthed diverticula were found in the                            sigmoid colon.                           The exam was otherwise without abnormality on                            direct and retroflexion views. Complications:            No immediate complications. Estimated Blood Loss:     Estimated blood loss: none. Impression:               - Post-polypectomy scar in the rectum.                           - The examination was otherwise normal on direct                            and retroflexion views.                           - No specimens collected. Recommendation:           - Patient has a contact number available for                            emergencies. The signs and symptoms of potential                            delayed complications were discussed with the                            patient. Return to normal activities tomorrow.                             Written discharge instructions were provided to the                            patient.                           - Resume previous diet.                           - Continue present medications.                           - Repeat colonoscopy in 7 years for surveillance.                           - Imodium 1 caplet PO daily. Gatha Mayer, MD 06/06/2015 1:47:00 PM This report has been signed electronically. CC Letter to:             Aundra Millet. Birdie Riddle, MD

## 2015-06-06 NOTE — Patient Instructions (Addendum)
No polyps again.  Next colonoscopy 7 years 2024.   Try Imodium AD 1/2 - 1 tab daily to see if that helps leakage.  Let me know if you need other help - the nerve stimulator may not work if there has been damage and loss of muscles from prior surgery but seeing a colorectal surgery specialist could help answer that.  I appreciate the opportunity to care for you. Gatha Mayer, MD, FACG    YOU HAD AN ENDOSCOPIC PROCEDURE TODAY AT [facility]:   Refer to the procedure report that was given to you for any specific questions about what was found during the examination.  If the procedure report does not answer your questions, please call your gastroenterologist to clarify.  If you requested that your care partner not be given the details of your procedure findings, then the procedure report has been included in a sealed envelope for you to review at your convenience later.  YOU SHOULD EXPECT: Some feelings of bloating in the abdomen. Passage of more gas than usual.  Walking can help get rid of the air that was put into your GI tract during the procedure and reduce the bloating. If you had a lower endoscopy (such as a colonoscopy or flexible sigmoidoscopy) you may notice spotting of blood in your stool or on the toilet paper. If you underwent a bowel prep for your procedure, you may not have a normal bowel movement for a few days.  Please Note:  You might notice some irritation and congestion in your nose or some drainage.  This is from the oxygen used during your procedure.  There is no need for concern and it should clear up in a day or so.  SYMPTOMS TO REPORT IMMEDIATELY:   Following lower endoscopy (colonoscopy or flexible sigmoidoscopy):  Excessive amounts of blood in the stool  Significant tenderness or worsening of abdominal pains  Swelling of the abdomen that is new, acute  Fever of 100F or higher   For urgent or emergent issues, a gastroenterologist can be  reached at any hour by calling [phone].   DIET: Your first meal following the procedure should be a small meal and then it is ok to progress to your normal diet. Heavy or fried foods are harder to digest and may make you feel nauseous or bloated.  Likewise, meals heavy in dairy and vegetables can increase bloating.  Drink plenty of fluids but you should avoid alcoholic beverages for 24 hours.  ACTIVITY:  You should plan to take it easy for the rest of today and you should NOT DRIVE or use heavy machinery until tomorrow (because of the sedation medicines used during the test).    FOLLOW UP: Our staff will call the number listed on your records the next business day following your procedure to check on you and address any questions or concerns that you may have regarding the information given to you following your procedure. If we do not reach you, we will leave a message.  However, if you are feeling well and you are not experiencing any problems, there is no need to return our call.  We will assume that you have returned to your regular daily activities without incident.  If any biopsies were taken you will be contacted by phone or by letter within the next 1-3 weeks.  Please call us at [phone] if you have not heard about the biopsies in 3 weeks.    SIGNATURES/CONFIDENTIALITY:  You and/or your care partner have signed paperwork which will be entered into your electronic medical record.  These signatures attest to the fact that that the information above on your After Visit Summary has been reviewed and is understood.  Full responsibility of the confidentiality of this discharge information lies with you and/or your care-partner.  Thank-you for choosing [place] for your healthcare needs today.

## 2015-06-06 NOTE — Progress Notes (Signed)
Report to PACU, RN, vss, BBS= Clear.  

## 2015-06-09 ENCOUNTER — Telehealth: Payer: Self-pay | Admitting: *Deleted

## 2015-06-09 NOTE — Telephone Encounter (Signed)
  Follow up Call-  Call back number 06/06/2015  Post procedure Call Back phone  # [phone]  Permission to leave phone message Yes     Patient questions:  Do you have a fever, pain , or abdominal swelling? No. Pain Score  0 *  Have you tolerated food without any problems? Yes.    Have you been able to return to your normal activities? Yes.    Do you have any questions about your discharge instructions: Diet   No. Medications  No. Follow up visit  No.  Do you have questions or concerns about your Care? No.  Actions: * If pain score is 4 or above: No action needed, pain <4.

## 2015-11-13 ENCOUNTER — Encounter: Payer: Self-pay | Admitting: Family Medicine

## 2015-11-13 ENCOUNTER — Ambulatory Visit (INDEPENDENT_AMBULATORY_CARE_PROVIDER_SITE_OTHER): Payer: Managed Care, Other (non HMO) | Admitting: Family Medicine

## 2015-11-13 VITALS — BP 112/80 | HR 76 | Temp 98.3°F | Resp 16 | Ht 61.0 in | Wt 153.2 lb

## 2015-11-13 DIAGNOSIS — Z0000 Encounter for general adult medical examination without abnormal findings: Secondary | ICD-10-CM | POA: Diagnosis not present

## 2015-11-13 LAB — HEPATIC FUNCTION PANEL
ALT: 9 U/L (ref 6–29)
AST: 23 U/L (ref 10–35)
Albumin: 4.4 g/dL (ref 3.6–5.1)
Alkaline Phosphatase: 78 U/L (ref 33–130)
BILIRUBIN DIRECT: 0.1 mg/dL (ref <=0.2)
BILIRUBIN INDIRECT: 0.3 mg/dL (ref 0.2–1.2)
Total Bilirubin: 0.4 mg/dL (ref 0.2–1.2)
Total Protein: 7.2 g/dL (ref 6.1–8.1)

## 2015-11-13 LAB — CBC WITH DIFFERENTIAL/PLATELET
BASOS PCT: 0 %
Basophils Absolute: 0 cells/uL (ref 0–200)
EOS ABS: 122 {cells}/uL (ref 15–500)
Eosinophils Relative: 2 %
HEMATOCRIT: 39 % (ref 35.0–45.0)
Hemoglobin: 13.4 g/dL (ref 11.7–15.5)
LYMPHS PCT: 43 %
Lymphs Abs: 2623 cells/uL (ref 850–3900)
MCH: 31.2 pg (ref 27.0–33.0)
MCHC: 34.4 g/dL (ref 32.0–36.0)
MCV: 90.7 fL (ref 80.0–100.0)
MONO ABS: 366 {cells}/uL (ref 200–950)
MONOS PCT: 6 %
MPV: 12.3 fL (ref 7.5–12.5)
NEUTROS ABS: 2989 {cells}/uL (ref 1500–7800)
Neutrophils Relative %: 49 %
Platelets: 214 10*3/uL (ref 140–400)
RBC: 4.3 MIL/uL (ref 3.80–5.10)
RDW: 13.2 % (ref 11.0–15.0)
WBC: 6.1 10*3/uL (ref 3.8–10.8)

## 2015-11-13 LAB — BASIC METABOLIC PANEL
BUN: 23 mg/dL (ref 7–25)
CHLORIDE: 102 mmol/L (ref 98–110)
CO2: 25 mmol/L (ref 20–31)
Calcium: 9.2 mg/dL (ref 8.6–10.4)
Creat: 0.76 mg/dL (ref 0.50–1.05)
GLUCOSE: 107 mg/dL — AB (ref 65–99)
POTASSIUM: 4 mmol/L (ref 3.5–5.3)
Sodium: 136 mmol/L (ref 135–146)

## 2015-11-13 LAB — LIPID PANEL
CHOLESTEROL: 360 mg/dL — AB (ref 125–200)
HDL: 127 mg/dL (ref >=46)
LDL Cholesterol: 197 mg/dL — ABNORMAL HIGH (ref <130)
Total CHOL/HDL Ratio: 2.8 Ratio (ref <=5.0)
Triglycerides: 182 mg/dL — ABNORMAL HIGH (ref <150)
VLDL: 36 mg/dL — ABNORMAL HIGH (ref <30)

## 2015-11-13 LAB — TSH: TSH: 2.34 mIU/L

## 2015-11-13 NOTE — Progress Notes (Signed)
   Subjective:    Patient ID: Rachel Fitzpatrick, female    DOB: 25-Aug-1960, 55 y.o.   MRN: [id number]  HPI CPE- UTD on pap, mammo, colonoscopy.  UTD on Tdap.  Will get flu at work.     Review of Systems Patient reports no vision/ hearing changes, adenopathy,fever, weight change,  persistant/recurrent hoarseness , swallowing issues, chest pain, palpitations, edema, persistant/recurrent cough, hemoptysis, dyspnea (rest/exertional/paroxysmal nocturnal), gastrointestinal bleeding (melena, rectal bleeding), abdominal pain, significant heartburn, bowel changes, GU symptoms (dysuria, hematuria, incontinence), Gyn symptoms (abnormal  bleeding, pain),  syncope, focal weakness, memory loss, numbness & tingling, skin/hair/nail changes, abnormal bruising or bleeding, anxiety, or depression.     Objective:   Physical Exam General Appearance:    Alert, cooperative, no distress, appears stated age  Head:    Normocephalic, without obvious abnormality, atraumatic  Eyes:    PERRL, conjunctiva/corneas clear, EOM's intact, fundi    benign, both eyes  Ears:    Normal TM's and external ear canals, both ears  Nose:   Nares normal, septum midline, mucosa normal, no drainage    or sinus tenderness  Throat:   Lips, mucosa, and tongue normal; teeth and gums normal  Neck:   Supple, symmetrical, trachea midline, no adenopathy;    Thyroid: no enlargement/tenderness/nodules  Back:     Symmetric, no curvature, ROM normal, no CVA tenderness  Lungs:     Clear to auscultation bilaterally, respirations unlabored  Chest Wall:    No tenderness or deformity   Heart:    Regular rate and rhythm, S1 and S2 normal, no murmur, rub   or gallop  Breast Exam:    Deferred to GYN  Abdomen:     Soft, non-tender, bowel sounds active all four quadrants,    no masses, no organomegaly  Genitalia:    Deferred to GYN  Rectal:    Extremities:   Extremities normal, atraumatic, no cyanosis or edema  Pulses:   2+ and symmetric all  extremities  Skin:   Skin color, texture, turgor normal, no rashes or lesions  Lymph nodes:   Cervical, supraclavicular, and axillary nodes normal  Neurologic:   CNII-XII intact, normal strength, sensation and reflexes    throughout          Assessment & Plan:

## 2015-11-13 NOTE — Progress Notes (Signed)
Pre visit review using our clinic review tool, if applicable. No additional management support is needed unless otherwise documented below in the visit note. 

## 2015-11-13 NOTE — Patient Instructions (Signed)
Follow up in 1 year or as needed We'll notify you of your lab results and make any changes if needed Keep up the good work on healthy diet and regular exercise- you look great! Call with any question or concerns Happy Fall!!!

## 2015-11-14 ENCOUNTER — Other Ambulatory Visit: Payer: Self-pay | Admitting: General Practice

## 2015-11-14 ENCOUNTER — Telehealth: Payer: Self-pay | Admitting: Family Medicine

## 2015-11-14 ENCOUNTER — Other Ambulatory Visit: Payer: Self-pay | Admitting: Family Medicine

## 2015-11-14 DIAGNOSIS — E785 Hyperlipidemia, unspecified: Secondary | ICD-10-CM

## 2015-11-14 LAB — VITAMIN D 25 HYDROXY (VIT D DEFICIENCY, FRACTURES): Vit D, 25-Hydroxy: 37 ng/mL (ref 30–100)

## 2015-11-14 MED ORDER — ROSUVASTATIN CALCIUM 20 MG PO TABS: 20.0000 mg | ORAL_TABLET | Freq: Every day | ORAL | 6 refills | Status: DC

## 2015-11-14 NOTE — Telephone Encounter (Signed)
Patient advised to schedule 3 month fasting labs. Patient will hold off on starting Crestor until after getting the fasting labs.

## 2015-11-14 NOTE — Telephone Encounter (Signed)
Advised patient per KT she must start the Crestor for her elevated cholesterol. Patient is agreeable with starting medication

## 2015-11-14 NOTE — Telephone Encounter (Signed)
Can you call pt back and inform that per Dr. Birdie Riddle she has to begin medications at this time.

## 2015-11-14 NOTE — Telephone Encounter (Signed)
Pt will need to return for lab only visit in 3 months to recheck cholesterol- please have her arrive fasting to do the lipid panel.  Please call and schedule pt

## 2015-11-14 NOTE — Telephone Encounter (Signed)
Pt would like a call back about lab results, Pt wants to know if she has to go on meds, if she has options or not and would like to talk to someone about this.

## 2015-11-14 NOTE — Telephone Encounter (Signed)
Spoke with patient and advised her per KT to start the Crestor medication for her elevated cholesterol. Patient is hesitant with starting medication. Patient states she was not fasting for labs and wanted her labs rechecked before starting medication.

## 2015-11-20 ENCOUNTER — Other Ambulatory Visit: Payer: Self-pay | Admitting: General Practice

## 2015-11-20 MED ORDER — CYCLOBENZAPRINE HCL 10 MG PO TABS: 10.0000 mg | ORAL_TABLET | Freq: Three times a day (TID) | ORAL | 0 refills | Status: DC | PRN

## 2015-11-20 NOTE — Telephone Encounter (Signed)
Last OV 11/13/15 Cyclobenzaprine last filled 03/20/15 #30 with 0

## 2016-01-02 ENCOUNTER — Other Ambulatory Visit: Payer: Managed Care, Other (non HMO)

## 2016-01-02 ENCOUNTER — Other Ambulatory Visit: Payer: Self-pay | Admitting: Family Medicine

## 2016-01-02 DIAGNOSIS — Z1231 Encounter for screening mammogram for malignant neoplasm of breast: Secondary | ICD-10-CM

## 2016-01-29 ENCOUNTER — Ambulatory Visit (HOSPITAL_BASED_OUTPATIENT_CLINIC_OR_DEPARTMENT_OTHER)
Admission: RE | Admit: 2016-01-29 | Discharge: 2016-01-29 | Disposition: A | Discharge status: Home / Self Care | Payer: Managed Care, Other (non HMO) | Source: Ambulatory Visit | Attending: Family Medicine | Admitting: Family Medicine

## 2016-01-29 DIAGNOSIS — Z1231 Encounter for screening mammogram for malignant neoplasm of breast: Secondary | ICD-10-CM

## 2016-02-13 ENCOUNTER — Other Ambulatory Visit (INDEPENDENT_AMBULATORY_CARE_PROVIDER_SITE_OTHER): Payer: Managed Care, Other (non HMO)

## 2016-02-13 DIAGNOSIS — E785 Hyperlipidemia, unspecified: Secondary | ICD-10-CM | POA: Diagnosis not present

## 2016-02-13 LAB — HEPATIC FUNCTION PANEL
ALK PHOS: 65 U/L (ref 39–117)
ALT: 9 U/L (ref 0–35)
AST: 18 U/L (ref 0–37)
Albumin: 4.5 g/dL (ref 3.5–5.2)
BILIRUBIN DIRECT: 0.1 mg/dL (ref 0.0–0.3)
BILIRUBIN TOTAL: 0.4 mg/dL (ref 0.2–1.2)
TOTAL PROTEIN: 7.1 g/dL (ref 6.0–8.3)

## 2016-02-13 LAB — LIPID PANEL
CHOL/HDL RATIO: 3
Cholesterol: 310 mg/dL — ABNORMAL HIGH (ref 0–200)
HDL: 107.8 mg/dL (ref >39.00)
LDL Cholesterol: 185 mg/dL — ABNORMAL HIGH (ref 0–99)
NonHDL: 201.91
TRIGLYCERIDES: 86 mg/dL (ref 0.0–149.0)
VLDL: 17.2 mg/dL (ref 0.0–40.0)

## 2016-02-16 ENCOUNTER — Telehealth: Payer: Self-pay | Admitting: Family Medicine

## 2016-02-16 DIAGNOSIS — E785 Hyperlipidemia, unspecified: Secondary | ICD-10-CM

## 2016-02-16 NOTE — Telephone Encounter (Signed)
Spoke with pt and advised of PCP recommendations. Pt will call back to schedule a lab only appt in 3 months. (labs already ordered)

## 2016-02-16 NOTE — Telephone Encounter (Signed)
Patient calling to report she has reviewed the mychart message regarding her cholesterol results.  She states she is has not been taking her Crestor and that she plans on going to the pharmacy today to pick it.  Patient would like to know when pcp wants to follow-up with her after she starts taking her medication to recheck cholesterol levels.

## 2016-02-16 NOTE — Telephone Encounter (Signed)
We need to repeat labs after she has been taking the medication x3 months

## 2016-03-02 ENCOUNTER — Encounter: Payer: Self-pay | Admitting: Family Medicine

## 2016-04-24 ENCOUNTER — Encounter (HOSPITAL_BASED_OUTPATIENT_CLINIC_OR_DEPARTMENT_OTHER): Payer: Self-pay | Admitting: Emergency Medicine

## 2016-04-24 ENCOUNTER — Emergency Department (HOSPITAL_BASED_OUTPATIENT_CLINIC_OR_DEPARTMENT_OTHER): Payer: Managed Care, Other (non HMO)

## 2016-04-24 ENCOUNTER — Emergency Department (HOSPITAL_BASED_OUTPATIENT_CLINIC_OR_DEPARTMENT_OTHER)
Admission: EM | Admit: 2016-04-24 | Discharge: 2016-04-25 | Disposition: A | Discharge status: Home / Self Care | Payer: Managed Care, Other (non HMO) | Attending: Emergency Medicine | Admitting: Emergency Medicine

## 2016-04-24 DIAGNOSIS — W1839XA Other fall on same level, initial encounter: Secondary | ICD-10-CM | POA: Insufficient documentation

## 2016-04-24 DIAGNOSIS — W19XXXA Unspecified fall, initial encounter: Secondary | ICD-10-CM

## 2016-04-24 DIAGNOSIS — Z79899 Other long term (current) drug therapy: Secondary | ICD-10-CM | POA: Insufficient documentation

## 2016-04-24 DIAGNOSIS — Y929 Unspecified place or not applicable: Secondary | ICD-10-CM | POA: Diagnosis not present

## 2016-04-24 DIAGNOSIS — S52502A Unspecified fracture of the lower end of left radius, initial encounter for closed fracture: Secondary | ICD-10-CM | POA: Insufficient documentation

## 2016-04-24 DIAGNOSIS — Y999 Unspecified external cause status: Secondary | ICD-10-CM | POA: Insufficient documentation

## 2016-04-24 DIAGNOSIS — Y939 Activity, unspecified: Secondary | ICD-10-CM | POA: Diagnosis not present

## 2016-04-24 DIAGNOSIS — S6992XA Unspecified injury of left wrist, hand and finger(s), initial encounter: Secondary | ICD-10-CM | POA: Diagnosis present

## 2016-04-24 MED ORDER — OXYCODONE-ACETAMINOPHEN 5-325 MG PO TABS
1.0000 | ORAL_TABLET | Freq: Once | ORAL | Status: AC
  Administered 2016-04-24: 1 via ORAL

## 2016-04-24 MED ORDER — HYDROCODONE-ACETAMINOPHEN 5-325 MG PO TABS: 1.0000 | ORAL_TABLET | ORAL | 0 refills | Status: DC | PRN

## 2016-04-24 MED ORDER — OXYCODONE-ACETAMINOPHEN 5-325 MG PO TABS
ORAL_TABLET | ORAL | Status: AC
  Filled 2016-04-24: qty 1

## 2016-04-24 NOTE — ED Notes (Signed)
MD at bedside, new icepack given.
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
## 2016-04-24 NOTE — ED Notes (Signed)
Ice pack applied to injury.
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
## 2016-04-25 NOTE — Discharge Instructions (Signed)
Rest ice and elevate your wrist. Pain medicine as needed. Call Dr. Angus Palms office Monday morning to schedule an appointment. Return to the ED if your swelling worsens, numbness or tingling, worsening pain or for any reason.

## 2016-05-03 ENCOUNTER — Encounter (HOSPITAL_COMMUNITY)
Admission: RE | Disposition: A | Discharge status: Home / Self Care | Payer: Self-pay | Source: Ambulatory Visit | Attending: Orthopedic Surgery

## 2016-05-03 ENCOUNTER — Ambulatory Visit (HOSPITAL_COMMUNITY)
Admission: RE | Admit: 2016-05-03 | Discharge: 2016-05-04 | Disposition: A | Discharge status: Home / Self Care | Payer: Managed Care, Other (non HMO) | Source: Ambulatory Visit | Attending: Orthopedic Surgery | Admitting: Orthopedic Surgery

## 2016-05-03 ENCOUNTER — Encounter (HOSPITAL_COMMUNITY): Payer: Self-pay | Admitting: *Deleted

## 2016-05-03 ENCOUNTER — Ambulatory Visit (HOSPITAL_COMMUNITY): Payer: Managed Care, Other (non HMO) | Admitting: Anesthesiology

## 2016-05-03 DIAGNOSIS — S52572A Other intraarticular fracture of lower end of left radius, initial encounter for closed fracture: Secondary | ICD-10-CM | POA: Diagnosis present

## 2016-05-03 DIAGNOSIS — F419 Anxiety disorder, unspecified: Secondary | ICD-10-CM | POA: Diagnosis not present

## 2016-05-03 DIAGNOSIS — K219 Gastro-esophageal reflux disease without esophagitis: Secondary | ICD-10-CM | POA: Diagnosis not present

## 2016-05-03 DIAGNOSIS — M8580 Other specified disorders of bone density and structure, unspecified site: Secondary | ICD-10-CM | POA: Insufficient documentation

## 2016-05-03 DIAGNOSIS — E7800 Pure hypercholesterolemia, unspecified: Secondary | ICD-10-CM | POA: Insufficient documentation

## 2016-05-03 DIAGNOSIS — M1990 Unspecified osteoarthritis, unspecified site: Secondary | ICD-10-CM | POA: Insufficient documentation

## 2016-05-03 DIAGNOSIS — S52502A Unspecified fracture of the lower end of left radius, initial encounter for closed fracture: Secondary | ICD-10-CM | POA: Diagnosis present

## 2016-05-03 DIAGNOSIS — X58XXXA Exposure to other specified factors, initial encounter: Secondary | ICD-10-CM | POA: Insufficient documentation

## 2016-05-03 HISTORY — DX: Pure hypercholesterolemia, unspecified: E78.00

## 2016-05-03 HISTORY — PX: ORIF WRIST FRACTURE: SHX2133

## 2016-05-03 HISTORY — DX: Other complications of anesthesia, initial encounter: T88.59XA

## 2016-05-03 HISTORY — DX: Adverse effect of unspecified anesthetic, initial encounter: T41.45XA

## 2016-05-03 HISTORY — DX: Gastro-esophageal reflux disease without esophagitis: K21.9

## 2016-05-03 SURGERY — OPEN REDUCTION INTERNAL FIXATION (ORIF) WRIST FRACTURE
Anesthesia: Monitor Anesthesia Care | Site: Arm Lower | Laterality: Left

## 2016-05-03 SURGERY — OPEN REDUCTION INTERNAL FIXATION (ORIF) DISTAL RADIUS FRACTURE
Anesthesia: IV Sedation (MBSC Only) | Laterality: Left

## 2016-05-03 MED ORDER — FENTANYL CITRATE (PF) 100 MCG/2ML IJ SOLN: 25.0000 ug | INTRAMUSCULAR | Status: DC | PRN

## 2016-05-03 MED ORDER — CHLORHEXIDINE GLUCONATE 4 % EX LIQD: 60.0000 mL | Freq: Once | CUTANEOUS | Status: DC

## 2016-05-03 MED ORDER — FENTANYL CITRATE (PF) 100 MCG/2ML IJ SOLN
50.0000 ug | Freq: Once | INTRAMUSCULAR | Status: AC
  Administered 2016-05-03: 50 ug via INTRAVENOUS

## 2016-05-03 MED ORDER — ROSUVASTATIN CALCIUM 20 MG PO TABS
20.0000 mg | ORAL_TABLET | Freq: Every day | ORAL | Status: DC
  Administered 2016-05-03: 20 mg via ORAL
  Filled 2016-05-03: qty 1
  Filled 2016-05-03: qty 2

## 2016-05-03 MED ORDER — FENTANYL CITRATE (PF) 100 MCG/2ML IJ SOLN
INTRAMUSCULAR | Status: AC
  Administered 2016-05-03: 50 ug via INTRAVENOUS
  Filled 2016-05-03: qty 2

## 2016-05-03 MED ORDER — CEFAZOLIN IN D5W 1 GM/50ML IV SOLN: 1.0000 g | INTRAVENOUS | Status: DC

## 2016-05-03 MED ORDER — DEXTROSE 5 % IV SOLN
500.0000 mg | Freq: Four times a day (QID) | INTRAVENOUS | Status: DC | PRN
  Filled 2016-05-03: qty 5

## 2016-05-03 MED ORDER — OXYCODONE-ACETAMINOPHEN 5-325 MG PO TABS
1.0000 | ORAL_TABLET | ORAL | Status: DC | PRN
  Administered 2016-05-03: 1 via ORAL
  Administered 2016-05-04 (×4): 2 via ORAL
  Filled 2016-05-03: qty 2
  Filled 2016-05-03: qty 1
  Filled 2016-05-03 (×3): qty 2

## 2016-05-03 MED ORDER — LORATADINE 10 MG PO TABS: 10.0000 mg | ORAL_TABLET | Freq: Every day | ORAL | Status: DC | PRN

## 2016-05-03 MED ORDER — VITAMIN D 1000 UNITS PO TABS
1000.0000 [IU] | ORAL_TABLET | Freq: Every day | ORAL | Status: DC
  Administered 2016-05-04: 1000 [IU] via ORAL
  Filled 2016-05-03 (×3): qty 1

## 2016-05-03 MED ORDER — CEFAZOLIN SODIUM-DEXTROSE 2-4 GM/100ML-% IV SOLN
INTRAVENOUS | Status: AC
  Filled 2016-05-03: qty 100

## 2016-05-03 MED ORDER — DIPHENHYDRAMINE HCL 25 MG PO CAPS: 25.0000 mg | ORAL_CAPSULE | Freq: Four times a day (QID) | ORAL | Status: DC | PRN

## 2016-05-03 MED ORDER — PROPOFOL 10 MG/ML IV BOLUS
INTRAVENOUS | Status: AC
  Filled 2016-05-03: qty 20

## 2016-05-03 MED ORDER — ADULT MULTIVITAMIN W/MINERALS CH
1.0000 | ORAL_TABLET | Freq: Every day | ORAL | Status: DC
  Administered 2016-05-03 – 2016-05-04 (×2): 1 via ORAL
  Filled 2016-05-03 (×2): qty 1

## 2016-05-03 MED ORDER — PROPOFOL 500 MG/50ML IV EMUL
INTRAVENOUS | Status: DC | PRN
  Administered 2016-05-03: 75 ug/kg/min via INTRAVENOUS

## 2016-05-03 MED ORDER — FLUTICASONE PROPIONATE 50 MCG/ACT NA SUSP
2.0000 | Freq: Every day | NASAL | Status: DC | PRN
  Filled 2016-05-03: qty 16

## 2016-05-03 MED ORDER — MEPERIDINE HCL 25 MG/ML IJ SOLN: 6.2500 mg | INTRAMUSCULAR | Status: DC | PRN

## 2016-05-03 MED ORDER — FENTANYL CITRATE (PF) 100 MCG/2ML IJ SOLN
INTRAMUSCULAR | Status: DC | PRN
  Administered 2016-05-03 (×2): 25 ug via INTRAVENOUS

## 2016-05-03 MED ORDER — METHOCARBAMOL 500 MG PO TABS
500.0000 mg | ORAL_TABLET | Freq: Four times a day (QID) | ORAL | Status: DC | PRN
  Administered 2016-05-03 – 2016-05-04 (×3): 500 mg via ORAL
  Filled 2016-05-03 (×3): qty 1

## 2016-05-03 MED ORDER — PANTOPRAZOLE SODIUM 40 MG PO TBEC: 40.0000 mg | DELAYED_RELEASE_TABLET | Freq: Every day | ORAL | Status: DC

## 2016-05-03 MED ORDER — DOCUSATE SODIUM 100 MG PO CAPS
100.0000 mg | ORAL_CAPSULE | Freq: Two times a day (BID) | ORAL | Status: DC
  Administered 2016-05-03 – 2016-05-04 (×2): 100 mg via ORAL
  Filled 2016-05-03 (×2): qty 1

## 2016-05-03 MED ORDER — GLUCOSAMINE CHONDR 1500 COMPLX PO CAPS: 1.0000 | ORAL_CAPSULE | Freq: Two times a day (BID) | ORAL | Status: DC

## 2016-05-03 MED ORDER — HYDROMORPHONE HCL 2 MG/ML IJ SOLN
0.5000 mg | INTRAMUSCULAR | Status: DC | PRN
  Administered 2016-05-03: 0.5 mg via INTRAVENOUS
  Filled 2016-05-03: qty 1

## 2016-05-03 MED ORDER — SERTRALINE HCL 100 MG PO TABS
200.0000 mg | ORAL_TABLET | Freq: Every day | ORAL | Status: DC
  Administered 2016-05-04: 200 mg via ORAL
  Filled 2016-05-03: qty 2

## 2016-05-03 MED ORDER — ROPIVACAINE HCL 7.5 MG/ML IJ SOLN
INTRAMUSCULAR | Status: DC | PRN
  Administered 2016-05-03: 15 mL via PERINEURAL

## 2016-05-03 MED ORDER — CEFAZOLIN SODIUM-DEXTROSE 2-4 GM/100ML-% IV SOLN
2.0000 g | INTRAVENOUS | Status: AC
  Administered 2016-05-03: 2 g via INTRAVENOUS

## 2016-05-03 MED ORDER — FENTANYL CITRATE (PF) 100 MCG/2ML IJ SOLN
INTRAMUSCULAR | Status: AC
  Filled 2016-05-03: qty 2

## 2016-05-03 MED ORDER — CEFAZOLIN IN D5W 1 GM/50ML IV SOLN
1.0000 g | Freq: Three times a day (TID) | INTRAVENOUS | Status: DC
  Filled 2016-05-03: qty 50

## 2016-05-03 MED ORDER — MEPIVACAINE HCL 1.5 % IJ SOLN
INTRAMUSCULAR | Status: DC | PRN
  Administered 2016-05-03: 15 mL via PERINEURAL

## 2016-05-03 MED ORDER — PROMETHAZINE HCL 25 MG/ML IJ SOLN: 6.2500 mg | INTRAMUSCULAR | Status: DC | PRN

## 2016-05-03 MED ORDER — HYDROCODONE-ACETAMINOPHEN 5-325 MG PO TABS
1.0000 | ORAL_TABLET | ORAL | Status: DC | PRN
  Administered 2016-05-03 – 2016-05-04 (×2): 2 via ORAL
  Administered 2016-05-04: 1 via ORAL
  Administered 2016-05-04: 2 via ORAL
  Filled 2016-05-03 (×4): qty 2

## 2016-05-03 MED ORDER — ONDANSETRON HCL 4 MG/2ML IJ SOLN
4.0000 mg | Freq: Four times a day (QID) | INTRAMUSCULAR | Status: DC | PRN
  Administered 2016-05-04 (×2): 4 mg via INTRAVENOUS
  Filled 2016-05-03 (×2): qty 2

## 2016-05-03 MED ORDER — MIDAZOLAM HCL 2 MG/2ML IJ SOLN
INTRAMUSCULAR | Status: AC
  Filled 2016-05-03: qty 2

## 2016-05-03 MED ORDER — LACTATED RINGERS IV SOLN: INTRAVENOUS | Status: DC

## 2016-05-03 MED ORDER — BUPIVACAINE HCL (PF) 0.25 % IJ SOLN
INTRAMUSCULAR | Status: AC
  Filled 2016-05-03: qty 30

## 2016-05-03 MED ORDER — MIDAZOLAM HCL 2 MG/2ML IJ SOLN
INTRAMUSCULAR | Status: AC
  Administered 2016-05-03: 2 mg via INTRAVENOUS
  Filled 2016-05-03: qty 2

## 2016-05-03 MED ORDER — ONDANSETRON HCL 4 MG PO TABS: 4.0000 mg | ORAL_TABLET | Freq: Four times a day (QID) | ORAL | Status: DC | PRN

## 2016-05-03 MED ORDER — PROPOFOL 1000 MG/100ML IV EMUL
INTRAVENOUS | Status: AC
  Filled 2016-05-03: qty 100

## 2016-05-03 MED ORDER — MIDAZOLAM HCL 2 MG/2ML IJ SOLN
2.0000 mg | Freq: Once | INTRAMUSCULAR | Status: AC
  Administered 2016-05-03: 2 mg via INTRAVENOUS

## 2016-05-03 MED ORDER — LACTATED RINGERS IV SOLN
INTRAVENOUS | Status: DC
  Administered 2016-05-03 (×3): via INTRAVENOUS

## 2016-05-03 MED ORDER — VITAMIN C 500 MG PO TABS
1000.0000 mg | ORAL_TABLET | Freq: Every day | ORAL | Status: DC
  Administered 2016-05-03 – 2016-05-04 (×2): 1000 mg via ORAL
  Filled 2016-05-03 (×2): qty 2

## 2016-05-03 MED ORDER — KCL IN DEXTROSE-NACL 20-5-0.45 MEQ/L-%-% IV SOLN
INTRAVENOUS | Status: DC
  Administered 2016-05-03: 22:00:00 via INTRAVENOUS
  Filled 2016-05-03: qty 1000

## 2016-05-03 SURGICAL SUPPLY — 64 items
BANDAGE ACE 4X5 VEL STRL LF (GAUZE/BANDAGES/DRESSINGS) ×3 IMPLANT
BANDAGE ELASTIC 3 VELCRO ST LF (GAUZE/BANDAGES/DRESSINGS) ×3 IMPLANT
BIT DRILL 2.2 SS TIBIAL (BIT) ×3 IMPLANT
BLADE CLIPPER SURG (BLADE) IMPLANT
BNDG ESMARK 4X9 LF (GAUZE/BANDAGES/DRESSINGS) ×3 IMPLANT
BNDG GAUZE ELAST 4 BULKY (GAUZE/BANDAGES/DRESSINGS) ×3 IMPLANT
CORDS BIPOLAR (ELECTRODE) ×3 IMPLANT
COVER SURGICAL LIGHT HANDLE (MISCELLANEOUS) ×3 IMPLANT
CUFF TOURNIQUET SINGLE 18IN (TOURNIQUET CUFF) ×3 IMPLANT
CUFF TOURNIQUET SINGLE 24IN (TOURNIQUET CUFF) IMPLANT
DRAIN TLS ROUND 10FR (DRAIN) IMPLANT
DRAPE OEC MINIVIEW 54X84 (DRAPES) ×3 IMPLANT
DRAPE SURG 17X11 SM STRL (DRAPES) ×3 IMPLANT
DRSG ADAPTIC 3X8 NADH LF (GAUZE/BANDAGES/DRESSINGS) ×3 IMPLANT
ELECT REM PT RETURN 9FT ADLT (ELECTROSURGICAL)
ELECTRODE REM PT RTRN 9FT ADLT (ELECTROSURGICAL) IMPLANT
GAUZE SPONGE 4X4 12PLY STRL (GAUZE/BANDAGES/DRESSINGS) ×3 IMPLANT
GLOVE BIOGEL PI IND STRL 8.5 (GLOVE) ×1 IMPLANT
GLOVE BIOGEL PI INDICATOR 8.5 (GLOVE) ×2
GLOVE SURG ORTHO 8.0 STRL STRW (GLOVE) ×3 IMPLANT
GOWN STRL REUS W/ TWL LRG LVL3 (GOWN DISPOSABLE) ×3 IMPLANT
GOWN STRL REUS W/ TWL XL LVL3 (GOWN DISPOSABLE) ×1 IMPLANT
GOWN STRL REUS W/TWL LRG LVL3 (GOWN DISPOSABLE) ×6
GOWN STRL REUS W/TWL XL LVL3 (GOWN DISPOSABLE) ×2
K-WIRE 1.6 (WIRE) ×2
K-WIRE FX5X1.6XNS BN SS (WIRE) ×1
KIT BASIN OR (CUSTOM PROCEDURE TRAY) ×3 IMPLANT
KIT ROOM TURNOVER OR (KITS) ×3 IMPLANT
KWIRE FX5X1.6XNS BN SS (WIRE) ×1 IMPLANT
MANIFOLD NEPTUNE II (INSTRUMENTS) ×3 IMPLANT
NEEDLE HYPO 25X1 1.5 SAFETY (NEEDLE) IMPLANT
NS IRRIG 1000ML POUR BTL (IV SOLUTION) ×3 IMPLANT
PACK ORTHO EXTREMITY (CUSTOM PROCEDURE TRAY) ×3 IMPLANT
PAD ARMBOARD 7.5X6 YLW CONV (MISCELLANEOUS) ×6 IMPLANT
PAD CAST 4YDX4 CTTN HI CHSV (CAST SUPPLIES) ×1 IMPLANT
PADDING CAST COTTON 4X4 STRL (CAST SUPPLIES) ×2
PEG LOCKING SMOOTH 2.2X14 (Peg) ×3 IMPLANT
PEG LOCKING SMOOTH 2.2X16 (Screw) ×6 IMPLANT
PEG LOCKING SMOOTH 2.2X18 (Peg) ×9 IMPLANT
PEG LOCKING SMOOTH 2.2X20 (Screw) ×3 IMPLANT
PLATE STANDARD DVR LEFT (Plate) ×3 IMPLANT
PLATE STD DVR LT 24X51 (Plate) ×1 IMPLANT
SCREW LOCK 12X2.7X 3 LD (Screw) ×2 IMPLANT
SCREW LOCK 14X2.7X 3 LD TPR (Screw) ×2 IMPLANT
SCREW LOCKING 2.7X12MM (Screw) ×4 IMPLANT
SCREW LOCKING 2.7X13MM (Screw) ×3 IMPLANT
SCREW LOCKING 2.7X14 (Screw) ×4 IMPLANT
SCREW MULTI DIRECTIONAL 2.7X16 (Screw) ×3 IMPLANT
SLING ARM FOAM STRAP XLG (SOFTGOODS) ×3 IMPLANT
SOAP 2 % CHG 4 OZ (WOUND CARE) ×3 IMPLANT
SPLINT FIBERGLASS 3X35 (CAST SUPPLIES) ×3 IMPLANT
SPONGE GAUZE 4X4 12PLY STER LF (GAUZE/BANDAGES/DRESSINGS) ×3 IMPLANT
SUT PROLENE 4 0 PS 2 18 (SUTURE) ×3 IMPLANT
SUT VIC AB 2-0 FS1 27 (SUTURE) IMPLANT
SUT VIC AB 2-0 SH 27 (SUTURE) ×2
SUT VIC AB 2-0 SH 27X BRD (SUTURE) ×1 IMPLANT
SUT VICRYL 4-0 PS2 18IN ABS (SUTURE) ×3 IMPLANT
SYR CONTROL 10ML LL (SYRINGE) IMPLANT
SYSTEM CHEST DRAIN TLS 7FR (DRAIN) IMPLANT
TOWEL OR 17X24 6PK STRL BLUE (TOWEL DISPOSABLE) ×3 IMPLANT
TOWEL OR 17X26 10 PK STRL BLUE (TOWEL DISPOSABLE) ×3 IMPLANT
TUBE CONNECTING 12'X1/4 (SUCTIONS) ×1
TUBE CONNECTING 12X1/4 (SUCTIONS) ×2 IMPLANT
WATER STERILE IRR 1000ML POUR (IV SOLUTION) ×3 IMPLANT

## 2016-05-03 NOTE — Discharge Instructions (Signed)
KEEP BANDAGE CLEAN AND DRY °CALL OFFICE FOR F/U APPT [phone] in 14 days °DR Lynzy Rawles CELL [phone] °KEEP HAND ELEVATED ABOVE HEART °OK TO APPLY ICE TO OPERATIVE AREA °CONTACT OFFICE IF ANY WORSENING PAIN OR CONCERNS. °

## 2016-05-03 NOTE — Anesthesia Procedure Notes (Signed)
Anesthesia Regional Block: Supraclavicular block   Pre-Anesthetic Checklist: ,, timeout performed, Correct Patient, Correct Site, Correct Laterality, Correct Procedure, Correct Position, site marked, Risks and benefits discussed,  Surgical consent,  Pre-op evaluation,  At surgeon's request and post-op pain management  Laterality: Left  Prep: chloraprep       Needles:  Injection technique: Single-shot  Needle Type: Echogenic Needle     Needle Length: 9cm  Needle Gauge: 21     Additional Needles:   Procedures: ultrasound guided,,,,,,,,  Narrative:  Start time: 05/03/2016 4:20 PM End time: 05/03/2016 4:26 PM Injection made incrementally with aspirations every 5 mL.  Performed by: Personally  Anesthesiologist: Catalina Gravel  Additional Notes: No pain on injection. No increased resistance to injection. Injection made in 5cc increments.  Good needle visualization.  Patient tolerated procedure well.

## 2016-05-03 NOTE — Op Note (Deleted)
  The note originally documented on this encounter has been moved the the encounter in which it belongs.  

## 2016-05-03 NOTE — H&P (Addendum)
Rachel Fitzpatrick is an 56 y.o. female.   Chief Complaint: LEFT DISTAL RADIUS FRACTURE  HPI: THE PATIENT INJURED HER LEFT WRIST AFTER A FALL IN HER HOME ON 04/24/16.  SHE WAS SEEN IN THE EMERGENCY DEPARTMENT WHERE SHE WAS PUT INTO A SPLINT AND REFERRED TO OUR OFFICE.  PATIENT WAS SEEN IN THE OFFICE ON 04/27/16 FOR FURTHER EVALUATION OF THE LEFT WRIST.  DISCUSSED THE RATIONALE FOR SURGERY, INCLUDING THE RISKS VERSUS BENEFITS.  PATIENT IS HERE TODAY FOR SURGERY.   Past Medical History:  Diagnosis Date  . Adenomatous colon polyp   . Allergy   . Anxiety   . Arthritis   . Blood in stool   . Complication of anesthesia    Reports hypotension with anesthesia  . GERD (gastroesophageal reflux disease)   . Hypercholesteremia   . Osteopenia     Past Surgical History:  Procedure Laterality Date  . APPENDECTOMY    . COLONOSCOPY    . FOOT SURGERY    . HAND SURGERY     tendon repair  . RECTAL POLYPECTOMY      Family History  Problem Relation Age of Onset  . Hyperlipidemia Mother   . Cancer Mother     lung cancer  . Hyperlipidemia Father   . Diabetes Father   . COPD Father   . Sudden death Neg Hx   . Heart attack Neg Hx   . Hypertension Neg Hx    Social History:  reports that she has never smoked. She has never used smokeless tobacco. She reports that she drinks alcohol. She reports that she does not use drugs.  Allergies:  Allergies  Allergen Reactions  . No Known Allergies     Medications Prior to Admission  Medication Sig Dispense Refill  . acetaminophen (TYLENOL) 500 MG tablet Take 1,000 mg by mouth every 6 (six) hours as needed for moderate pain.    . Calcium Citrate-Vitamin D (CALCIUM + D PO) Take 1 tablet by mouth 2 (two) times daily.    . cholecalciferol (VITAMIN D) 1000 UNITS tablet Take 1,000 Units by mouth daily.    . cycloSPORINE (RESTASIS) 0.05 % ophthalmic emulsion Place 1 drop into both eyes 2 (two) times daily.    . fluticasone (FLONASE) 50 MCG/ACT nasal spray  Place 2 sprays into both nostrils daily. (Patient taking differently: Place 2 sprays into both nostrils daily as needed for allergies. ) 16 g 6  . Glucosamine-Chondroit-Vit C-Mn (GLUCOSAMINE CHONDR 1500 COMPLX) CAPS Take 1 capsule by mouth 2 (two) times daily.    Marland Kitchen HYDROcodone-acetaminophen (NORCO/VICODIN) 5-325 MG tablet Take 1-2 tablets by mouth every 4 (four) hours as needed. (Patient taking differently: Take 1-2 tablets by mouth every 4 (four) hours as needed for moderate pain. ) 15 tablet 0  . omeprazole (PRILOSEC) 20 MG capsule Take 20 mg by mouth daily as needed (heartburn).    . Polyvinyl Alcohol (LUBRICANT DROPS OP) Apply 1 drop to eye 4 (four) times daily as needed (dry eyes).    . rosuvastatin (CRESTOR) 20 MG tablet Take 1 tablet (20 mg total) by mouth daily. (Patient taking differently: Take 20 mg by mouth every evening. ) 30 tablet 6  . sertraline (ZOLOFT) 100 MG tablet Take 200 mg by mouth daily.     . cetirizine (ZYRTEC) 10 MG tablet Take 1 tablet (10 mg total) by mouth daily. (Patient taking differently: Take 10 mg by mouth daily as needed for allergies. ) 30 tablet 11  . cyclobenzaprine (FLEXERIL) 10  MG tablet Take 1 tablet (10 mg total) by mouth 3 (three) times daily as needed for muscle spasms. (Patient taking differently: Take 10 mg by mouth at bedtime as needed for muscle spasms. ) 30 tablet 0  . MELATONIN PO Take 1 tablet by mouth at bedtime as needed (sleep).    . meloxicam (MOBIC) 15 MG tablet Take 15 mg by mouth daily as needed for pain.      No results found for this or any previous visit (from the past 48 hour(s)). No results found.  ROS NO RECENT ILLNESSES OR HOSPITALIZATIONS  Blood pressure 123/78, pulse 67, temperature 98.7 F (37.1 C), temperature source Oral, resp. rate 18, height 5\' 1"  (1.549 m), weight 75.8 kg (167 lb), SpO2 100 %. Physical Exam  General Appearance:  Alert, cooperative, no distress, appears stated age  Head:  Normocephalic, without obvious  abnormality, atraumatic  Eyes:  Pupils equal, conjunctiva/corneas clear,         Throat: Lips, mucosa, and tongue normal; teeth and gums normal  Neck: No visible masses     Lungs:   respirations unlabored  Chest Wall:  No tenderness or deformity  Heart:  Regular rate and rhythm,  Abdomen:   Soft, non-tender,         Extremities: LUE: SPLINT IN PLACE FINGERS WARM WELL PERFUSED  Pulses: 2+ and symmetric  Skin: Skin color, texture, turgor normal, no rashes or lesions     Neurologic: Normal    Assessment LEFT DISTAL RADIUS FRACTURE,DISPLACED ANGULATED  Plan LEFT DISTAL RADIUS OPEN REDUCTION AND INTERNAL FIXATION WITH REPAIR AS INDICATED OPEN REDUCTION AND INTERNAL FIXATION  R/B/A DISCUSSED WITH PT IN OFFICE.  PT VOICED UNDERSTANDING OF PLAN CONSENT SIGNED DAY OF SURGERY PT SEEN AND EXAMINED PRIOR TO OPERATIVE PROCEDURE/DAY OF SURGERY SITE MARKED. QUESTIONS ANSWERED WILL REMAIN OVERNIGHT OBSERVATION  FOLLOWING SURGERY  WE ARE PLANNING SURGERY FOR YOUR UPPER EXTREMITY. THE RISKS AND BENEFITS OF SURGERY INCLUDE BUT NOT LIMITED TO BLEEDING INFECTION, DAMAGE TO NEARBY NERVES ARTERIES TENDONS, FAILURE OF SURGERY TO ACCOMPLISH ITS INTENDED GOALS, PERSISTENT SYMPTOMS AND NEED FOR FURTHER SURGICAL INTERVENTION. WITH THIS IN MIND WE WILL PROCEED. I HAVE DISCUSSED WITH THE PATIENT THE PRE AND POSTOPERATIVE REGIMEN AND THE DOS AND DON'TS. PT VOICED UNDERSTANDING AND INFORMED CONSENT SIGNED.  Brynda Peon 05/03/2016, 4:08 PM

## 2016-05-03 NOTE — Anesthesia Procedure Notes (Signed)
Procedure Name: MAC Date/Time: 05/03/2016 5:36 AM Performed by: Oletta Lamas Pre-anesthesia Checklist: Patient identified, Emergency Drugs available, Patient being monitored and Suction available Patient Re-evaluated:Patient Re-evaluated prior to inductionOxygen Delivery Method: Simple face mask

## 2016-05-03 NOTE — Anesthesia Preprocedure Evaluation (Addendum)
Anesthesia Evaluation  Patient identified by MRN, date of birth, ID band Patient awake    Reviewed: Allergy & Precautions, NPO status , Patient's Chart, lab work & pertinent test results  Airway Mallampati: II  TM Distance: >3 FB Neck ROM: Full    Dental  (+) Teeth Intact, Dental Advisory Given   Pulmonary neg pulmonary ROS,    Pulmonary exam normal breath sounds clear to auscultation       Cardiovascular negative cardio ROS Normal cardiovascular exam Rhythm:Regular Rate:Normal     Neuro/Psych Anxiety negative neurological ROS     GI/Hepatic Neg liver ROS, GERD  Medicated,  Endo/Other  negative endocrine ROS  Renal/GU negative Renal ROS  negative genitourinary   Musculoskeletal  (+) Arthritis ,   Abdominal   Peds negative pediatric ROS (+)  Hematology negative hematology ROS (+)   Anesthesia Other Findings   Reproductive/Obstetrics negative OB ROS                            Lab Results  Component Value Date   WBC 6.1 11/13/2015   HGB 13.4 11/13/2015   HCT 39.0 11/13/2015   MCV 90.7 11/13/2015   PLT 214 11/13/2015   Lab Results  Component Value Date   CREATININE 0.76 11/13/2015   BUN 23 11/13/2015   NA 136 11/13/2015   K 4.0 11/13/2015   CL 102 11/13/2015   CO2 25 11/13/2015   No results found for: INR, PROTIME  EKG: normal sinus rhythm.   Anesthesia Physical Anesthesia Plan  ASA: II  Anesthesia Plan: MAC and Regional   Post-op Pain Management:    Induction: Intravenous  Airway Management Planned: Simple Face Mask  Additional Equipment:   Intra-op Plan:   Post-operative Plan:   Informed Consent: I have reviewed the patients History and Physical, chart, labs and discussed the procedure including the risks, benefits and alternatives for the proposed anesthesia with the patient or authorized representative who has indicated his/her understanding and acceptance.    Dental advisory given  Plan Discussed with: CRNA  Anesthesia Plan Comments: (Left supraclavicular nerve block plus MAC sedation.)       Anesthesia Quick Evaluation

## 2016-05-03 NOTE — Brief Op Note (Signed)
Job id [id number] orif left distal radius Observation admission Home in am

## 2016-05-03 NOTE — Op Note (Signed)
[name], Fitzpatrick                ACCOUNT NO.:  [id number]  MEDICAL RECORD NO.:  [id number]  LOCATION:                                 FACILITY:  PHYSICIAN:  Linna Hoff IV, Rachel FitzpatrickDATE OF BIRTH:  03/14/1960  DATE OF PROCEDURE:  05/03/2016 DATE OF DISCHARGE:                              OPERATIVE REPORT   PREOPERATIVE DIAGNOSIS:  Left wrist intra-articular distal radius fracture of two or more fragments.  POSTOPERATIVE DIAGNOSIS:  Left wrist intra-articular distal radius fracture of two or more fragments.  ATTENDING PHYSICIAN:  Linna Hoff, M.D., was scrubbed and present for the entire procedure.  ASSISTANT SURGEON:  None.  ANESTHESIA:  Supraclavicular block with IV sedation.  SURGICAL PROCEDURES: 1. Open treatment of left wrist intra-articular distal radius fracture     of two or more fragments. 2. Left wrist brachioradialis tendon release and tendon tenotomy. 3. Radiographs 3 views, left wrist.  RADIOGRAPHIC INTERPRETATION:  AP, lateral and oblique views of the wrist did show the volar plate fixation in place with good restoration of radial height, inclination and tilt.  SURGICAL IMPLANTS:  DVR Crosslock, [employer].  SURGICAL INDICATIONS:  Rachel Fitzpatrick is a 56 year old right-hand-dominant female who sustained the closed injury to her left distal radius.  The patient was seen and evaluated in the office and recommended to undergo the above procedure.  Risks, benefits and alternatives were discussed in detail with the patient and signed informed consent was obtained.  Risks include, but not limited to bleeding, infection, damage to nearby nerves, arteries, or tendons; nonunion, malunion, hardware failure, loss of motion of the wrist and digits, incomplete relief of symptoms and need for further surgical intervention.  DESCRIPTION OF PROCEDURE:  The patient was properly identified in the preoperative holding area and marked with permanent marker made on the left  wrist to indicate the correct operative side.  The patient was brought back to the operating room and placed supine on the anesthesia room table where the IV sedation administered.  A well-padded tourniquet was placed on the left brachium, sealed with 1000-drape.  The left upper extremity was then prepped and draped in normal sterile fashion.  Time- out was called.  Correct site was identified and procedure begun. Attention was then turned to left wrist.  A longitudinal incision was made directly over the FCR sheath.  Tourniquet inflated.  Dissection was carried down through the skin and subcutaneous tissue.  Deep dissection was carried down through the FCR sheath.  The FPL was then carefully swept out of the way and the pronator quadratus was then elevated in an L-shaped fashion exposing the distal radius.  This was an intra- articular distal radius fracture of two or more fragments.  Careful release of the brachioradialis was down at the radial styloid.  This reduced the radial column.  Open reduction was then performed.  The volar plate was then placed and held in place with a K-wire distally. Plate position was then confirmed.  Shaft fixation was then carried out with oblong screw hole.  Following this, the distal pegs fixation was then carried out with the distal locking pegs.  There was one variable angle screw placed in  the proximal ulna to avoid penetration of the distal radioulnar joint.  Final fixation was then carried out proximally.  The wound was then thoroughly irrigated.  Final radiographs were then obtained.  AP and lateral oblique films as well as stress radiographs were then obtained under live fluoro to make sure there was no irregularity or insufficiency of the distal radioulnar joint and widening of the FCL interval.  The wound was then thoroughly irrigated. The pronator quadratus was then closed with 2-0 Vicryl, subcutaneous tissue was closed with 4-0 Vicryl, skin  closed with simple 4-0 Prolene. Adaptic dressing and sterile compressive bandage were applied.  The patient was then placed in a well-padded sugar-tong splint, taken to the recovery room in good condition.  POSTPROCEDURAL PLAN:  The patient was admitted overnight for IV antibiotics and pain control, discharge in the morning, seen back in the office approximately 2 weeks for wound check, suture removal, x-rays, application of short-arm cast, putting the therapy order at the first visit and begin the therapy regimen at the 4-week mark, radiographs at each visit.     Rachel Fitzpatrick, M.D.   ______________________________ Rachel Fitzpatrick, M.D.    FWO/MEDQ  D:  05/03/2016  T:  05/03/2016  Job:  [id number]

## 2016-05-04 ENCOUNTER — Encounter (HOSPITAL_COMMUNITY): Payer: Self-pay | Admitting: Orthopedic Surgery

## 2016-05-04 DIAGNOSIS — S52572A Other intraarticular fracture of lower end of left radius, initial encounter for closed fracture: Secondary | ICD-10-CM | POA: Diagnosis not present

## 2016-05-04 MED ORDER — PANTOPRAZOLE SODIUM 40 MG PO TBEC
40.0000 mg | DELAYED_RELEASE_TABLET | Freq: Every day | ORAL | Status: DC
  Administered 2016-05-04 (×2): 40 mg via ORAL
  Filled 2016-05-04 (×2): qty 1

## 2016-05-04 MED ORDER — ALUM & MAG HYDROXIDE-SIMETH 200-200-20 MG/5ML PO SUSP
30.0000 mL | Freq: Once | ORAL | Status: AC
  Administered 2016-05-04: 30 mL via ORAL
  Filled 2016-05-04: qty 30

## 2016-05-04 NOTE — Progress Notes (Signed)
Pt ready for discharge. Education/instructions reviewed with pt and husband, and all questions/concerns addressed. IV removed and belongings gathered. Pt will be transported out via wheelchair to husband's vehicle.

## 2016-05-04 NOTE — Transfer of Care (Signed)
Immediate Anesthesia Transfer of Care Note  Patient: Rachel Fitzpatrick  Procedure(s) Performed: Procedure(s): OPEN REDUCTION INTERNAL FIXATION (ORIF) WRIST FRACTURE/LEFT (Left)  Patient Location: PACU  Anesthesia Type:MAC combined with regional for post-op pain  Level of Consciousness: awake, alert , oriented and patient cooperative  Airway & Oxygen Therapy: Patient Spontanous Breathing  Post-op Assessment: Report given to RN and Post -op Vital signs reviewed and stable  Post vital signs: Reviewed and stable  Last Vitals:  Vitals:   05/04/16 0015 05/04/16 0344  BP: 113/70 93/60  Pulse: 69 68  Resp: 16 16  Temp: 36.7 C 36.9 C    Last Pain:  Vitals:   05/04/16 0833  TempSrc:   PainSc: 8       Patients Stated Pain Goal: 3 (55/20/80 2233)  Complications: No apparent anesthesia complications   RR even and unlabored.  Saturation 96% on room air. Denies any pain or discomforts.

## 2016-05-04 NOTE — Discharge Summary (Signed)
Physician Discharge Summary  Patient ID: HYNLEE STUDER MRN: [id number] DOB/AGE: 1960-08-16 56 y.o.  Admit date: 05/03/2016 Discharge date: 05/04/16  Admission Diagnoses: LEFT DISTAL RADIUS FRACTURE Past Medical History:  Diagnosis Date  . Adenomatous colon polyp   . Allergy   . Anxiety   . Arthritis   . Blood in stool   . Complication of anesthesia    Reports hypotension with anesthesia  . GERD (gastroesophageal reflux disease)   . Hypercholesteremia   . Osteopenia     Discharge Diagnoses:  Active Problems:   Distal radius fracture, left   Surgeries: Procedure(s): OPEN REDUCTION INTERNAL FIXATION (ORIF) WRIST FRACTURE/LEFT on 05/03/2016    Consultants:   Discharged Condition: Improved  Hospital Course: ROKISHA OBRION is an 56 y.o. female who was admitted 05/03/2016 with a chief complaint of No chief complaint on file. , and found to have a diagnosis of LEFT DISTAL RADIUS FRACTURE.  They were brought to the operating room on 05/03/2016 and underwent Procedure(s): OPEN REDUCTION INTERNAL FIXATION (ORIF) WRIST FRACTURE/LEFT.    They were given perioperative antibiotics: Anti-infectives    Start     Dose/Rate Route Frequency Ordered Stop   05/04/16 2200  ceFAZolin (ANCEF) IVPB 1 g/50 mL premix     1 g 100 mL/hr over 30 Minutes Intravenous Every 8 hours 05/03/16 2031     05/03/16 2045  ceFAZolin (ANCEF) IVPB 1 g/50 mL premix  Status:  Discontinued     1 g 100 mL/hr over 30 Minutes Intravenous NOW 05/03/16 2031 05/03/16 2037   05/03/16 1700  ceFAZolin (ANCEF) IVPB 2g/100 mL premix     2 g 200 mL/hr over 30 Minutes Intravenous On call to O.R. 05/03/16 1608 05/03/16 1746   05/03/16 1636  ceFAZolin (ANCEF) 2-4 GM/100ML-% IVPB    Comments:  Henrine Screws   : cabinet override      05/03/16 1636 05/03/16 1736    .  They were given sequential compression devices, early ambulation, and Other (comment) - ambulation for DVT prophylaxis.  Recent vital signs: Patient Vitals for the  past 24 hrs:  BP Temp Temp src Pulse Resp SpO2 Height Weight  05/04/16 0344 93/60 98.5 F (36.9 C) Oral 68 16 96 % - -  05/04/16 0015 113/70 98.1 F (36.7 C) Oral 69 16 96 % - -  05/03/16 2034 119/86 98 F (36.7 C) Oral 63 16 98 % - -  05/03/16 1937 119/74 97.7 F (36.5 C) - (!) 59 13 99 % - -  05/03/16 1925 117/69 - - 62 16 100 % - -  05/03/16 1910 119/68 - - (!) 57 16 98 % - -  05/03/16 1855 114/87 97.2 F (36.2 C) - 70 18 100 % - -  05/03/16 1629 126/76 - - 72 16 100 % - -  05/03/16 1628 - - - 71 19 100 % - -  05/03/16 1627 - - - 78 17 100 % - -  05/03/16 1626 - - - 76 (!) 21 100 % - -  05/03/16 1625 - - - 74 (!) 22 100 % - -  05/03/16 1624 137/83 - - 80 (!) 22 100 % - -  05/03/16 1623 - - - 74 (!) 21 100 % - -  05/03/16 1622 - - - 69 (!) 21 100 % - -  05/03/16 1621 - - - 68 20 100 % - -  05/03/16 1620 - - - 66 (!) 21 100 % - -  05/03/16 1619  140/82 - - 72 19 100 % - -  05/03/16 1618 - - - 72 (!) 31 100 % - -  05/03/16 1444 123/78 98.7 F (37.1 C) Oral 67 18 100 % 5\' 1"  (1.549 m) 75.8 kg (167 lb)  .  Recent laboratory studies: No results found.  Discharge Medications:   Allergies as of 05/04/2016      Reactions   No Known Allergies       Medication List    TAKE these medications   acetaminophen 500 MG tablet Commonly known as:  TYLENOL Take 1,000 mg by mouth every 6 (six) hours as needed for moderate pain.   CALCIUM + D PO Take 1 tablet by mouth 2 (two) times daily.   cetirizine 10 MG tablet Commonly known as:  ZYRTEC Take 1 tablet (10 mg total) by mouth daily. What changed:  when to take this  reasons to take this   cholecalciferol 1000 units tablet Commonly known as:  VITAMIN D Take 1,000 Units by mouth daily.   cyclobenzaprine 10 MG tablet Commonly known as:  FLEXERIL Take 1 tablet (10 mg total) by mouth 3 (three) times daily as needed for muscle spasms. What changed:  when to take this   cycloSPORINE 0.05 % ophthalmic emulsion Commonly known as:   RESTASIS Place 1 drop into both eyes 2 (two) times daily.   fluticasone 50 MCG/ACT nasal spray Commonly known as:  FLONASE Place 2 sprays into both nostrils daily. What changed:  when to take this  reasons to take this   GLUCOSAMINE CHONDR 1500 COMPLX Caps Take 1 capsule by mouth 2 (two) times daily.   HYDROcodone-acetaminophen 5-325 MG tablet Commonly known as:  NORCO/VICODIN Take 1-2 tablets by mouth every 4 (four) hours as needed. What changed:  reasons to take this   LUBRICANT DROPS OP Apply 1 drop to eye 4 (four) times daily as needed (dry eyes).   MELATONIN PO Take 1 tablet by mouth at bedtime as needed (sleep).   meloxicam 15 MG tablet Commonly known as:  MOBIC Take 15 mg by mouth daily as needed for pain.   omeprazole 20 MG capsule Commonly known as:  PRILOSEC Take 20 mg by mouth daily as needed (heartburn).   rosuvastatin 20 MG tablet Commonly known as:  CRESTOR Take 1 tablet (20 mg total) by mouth daily. What changed:  when to take this   sertraline 100 MG tablet Commonly known as:  ZOLOFT Take 200 mg by mouth daily.       Diagnostic Studies: Dg Wrist Complete Left  Result Date: 04/24/2016 CLINICAL DATA:  56 year old female with fall and trauma to the left wrist. EXAM: LEFT WRIST - COMPLETE 3+ VIEW COMPARISON:  None. FINDINGS: There is a dorsally angulated transverse fracture of the distal radius with apparent intra-articular extension of the fracture. There is also a minimally displaced fracture of the ulnar-styloid. The bones are osteopenic. No other acute fracture identified. There is no dislocation. There is diffuse soft tissue swelling of the wrist. No radiopaque foreign object identified. IMPRESSION: Dorsally angulated intra-articular fracture of the distal radius as well as a minimally displaced fracture of the ulnar-styloid. No dislocation. Electronically Signed   By: Anner Crete M.D.   On: 04/24/2016 22:08    They benefited maximally from  their hospital stay and there were no complications.     Disposition: 01-Home or Self Care Discharge Instructions    Discharge patient    Complete by:  As directed  Discharge disposition:  01-Home or Self Care   Discharge patient date:  05/04/2016     Follow-up [place], [place] In 2 weeks.   Specialty:  Orthopedic Surgery Contact information: [address] [place] [address] [id number]            Signed: Brynda Peon 05/04/2016, 2:28 PM

## 2016-05-05 ENCOUNTER — Encounter (HOSPITAL_COMMUNITY): Payer: Self-pay | Admitting: Orthopedic Surgery

## 2016-05-05 ENCOUNTER — Other Ambulatory Visit: Payer: Self-pay | Admitting: General Practice

## 2016-05-05 MED ORDER — ROSUVASTATIN CALCIUM 20 MG PO TABS: 20.0000 mg | ORAL_TABLET | Freq: Every evening | ORAL | 1 refills | Status: DC

## 2016-05-05 NOTE — Anesthesia Postprocedure Evaluation (Signed)
Anesthesia Post Note  Patient: Rachel Fitzpatrick  Procedure(s) Performed: Procedure(s) (LRB): OPEN REDUCTION INTERNAL FIXATION (ORIF) WRIST FRACTURE/LEFT (Left)  Patient location during evaluation: PACU Anesthesia Type: Regional and MAC Level of consciousness: awake and alert Pain management: pain level controlled Vital Signs Assessment: post-procedure vital signs reviewed and stable Respiratory status: spontaneous breathing, nonlabored ventilation, respiratory function stable and patient connected to nasal cannula oxygen Cardiovascular status: blood pressure returned to baseline and stable Postop Assessment: no signs of nausea or vomiting Anesthetic complications: no       Last Vitals:  Vitals:   05/04/16 0015 05/04/16 0344  BP: 113/70 93/60  Pulse: 69 68  Resp: 16 16  Temp: 36.7 C 36.9 C    Last Pain:  Vitals:   05/04/16 1457  TempSrc:   PainSc: 8                  Catalina Gravel

## 2016-05-27 ENCOUNTER — Other Ambulatory Visit (INDEPENDENT_AMBULATORY_CARE_PROVIDER_SITE_OTHER): Payer: Managed Care, Other (non HMO)

## 2016-05-27 DIAGNOSIS — E785 Hyperlipidemia, unspecified: Secondary | ICD-10-CM | POA: Diagnosis not present

## 2016-05-27 LAB — LIPID PANEL
CHOL/HDL RATIO: 2
Cholesterol: 234 mg/dL — ABNORMAL HIGH (ref 0–200)
HDL: 104.5 mg/dL (ref >39.00)
LDL CALC: 115 mg/dL — AB (ref 0–99)
NONHDL: 129.1
TRIGLYCERIDES: 71 mg/dL (ref 0.0–149.0)
VLDL: 14.2 mg/dL (ref 0.0–40.0)

## 2016-06-29 ENCOUNTER — Other Ambulatory Visit: Payer: Self-pay | Admitting: Orthopedic Surgery

## 2016-06-29 ENCOUNTER — Other Ambulatory Visit: Payer: Self-pay | Admitting: Physician Assistant

## 2016-06-29 DIAGNOSIS — M8580 Other specified disorders of bone density and structure, unspecified site: Secondary | ICD-10-CM

## 2016-06-29 DIAGNOSIS — Z780 Asymptomatic menopausal state: Secondary | ICD-10-CM

## 2016-07-02 ENCOUNTER — Ambulatory Visit
Admission: RE | Admit: 2016-07-02 | Discharge: 2016-07-02 | Disposition: A | Discharge status: Home / Self Care | Payer: Managed Care, Other (non HMO) | Source: Ambulatory Visit | Attending: Physician Assistant | Admitting: Physician Assistant

## 2016-07-02 DIAGNOSIS — M8580 Other specified disorders of bone density and structure, unspecified site: Secondary | ICD-10-CM

## 2016-07-02 DIAGNOSIS — Z780 Asymptomatic menopausal state: Secondary | ICD-10-CM

## 2016-08-20 ENCOUNTER — Other Ambulatory Visit: Payer: Self-pay | Admitting: Family Medicine

## 2017-01-06 ENCOUNTER — Other Ambulatory Visit: Payer: Self-pay | Admitting: Family Medicine

## 2017-01-06 DIAGNOSIS — Z1231 Encounter for screening mammogram for malignant neoplasm of breast: Secondary | ICD-10-CM

## 2017-02-08 ENCOUNTER — Ambulatory Visit: Payer: Self-pay | Admitting: Family Medicine

## 2017-02-16 ENCOUNTER — Ambulatory Visit (HOSPITAL_BASED_OUTPATIENT_CLINIC_OR_DEPARTMENT_OTHER)
Admission: RE | Admit: 2017-02-16 | Discharge: 2017-02-16 | Disposition: A | Discharge status: Home / Self Care | Payer: Managed Care, Other (non HMO) | Source: Ambulatory Visit | Attending: Family Medicine | Admitting: Family Medicine

## 2017-02-16 ENCOUNTER — Encounter: Payer: Self-pay | Admitting: General Practice

## 2017-02-16 DIAGNOSIS — Z1231 Encounter for screening mammogram for malignant neoplasm of breast: Secondary | ICD-10-CM | POA: Insufficient documentation

## 2017-03-09 ENCOUNTER — Ambulatory Visit: Payer: Self-pay | Admitting: *Deleted

## 2017-03-09 NOTE — Telephone Encounter (Signed)
Pt called because she stated that she had broken rib. She wanted to get an xray and  needs to be seen at [facility] for this first. She says that she is in pain. She is at work in [employer] and going to the [facility] on [address]. She states that she has a hx of having fractures and needing to get the Prolia injection.

## 2017-03-10 ENCOUNTER — Encounter (HOSPITAL_BASED_OUTPATIENT_CLINIC_OR_DEPARTMENT_OTHER): Payer: Self-pay | Admitting: *Deleted

## 2017-03-10 ENCOUNTER — Emergency Department (HOSPITAL_BASED_OUTPATIENT_CLINIC_OR_DEPARTMENT_OTHER)
Admission: EM | Admit: 2017-03-10 | Discharge: 2017-03-11 | Disposition: A | Discharge status: Home / Self Care | Payer: Managed Care, Other (non HMO) | Attending: Emergency Medicine | Admitting: Emergency Medicine

## 2017-03-10 ENCOUNTER — Emergency Department (HOSPITAL_BASED_OUTPATIENT_CLINIC_OR_DEPARTMENT_OTHER): Payer: Managed Care, Other (non HMO)

## 2017-03-10 ENCOUNTER — Other Ambulatory Visit: Payer: Self-pay

## 2017-03-10 DIAGNOSIS — Y999 Unspecified external cause status: Secondary | ICD-10-CM | POA: Insufficient documentation

## 2017-03-10 DIAGNOSIS — Y929 Unspecified place or not applicable: Secondary | ICD-10-CM | POA: Diagnosis not present

## 2017-03-10 DIAGNOSIS — Z79899 Other long term (current) drug therapy: Secondary | ICD-10-CM | POA: Insufficient documentation

## 2017-03-10 DIAGNOSIS — W19XXXA Unspecified fall, initial encounter: Secondary | ICD-10-CM

## 2017-03-10 DIAGNOSIS — W010XXA Fall on same level from slipping, tripping and stumbling without subsequent striking against object, initial encounter: Secondary | ICD-10-CM | POA: Diagnosis not present

## 2017-03-10 DIAGNOSIS — Y92009 Unspecified place in unspecified non-institutional (private) residence as the place of occurrence of the external cause: Secondary | ICD-10-CM

## 2017-03-10 DIAGNOSIS — S52591A Other fractures of lower end of right radius, initial encounter for closed fracture: Secondary | ICD-10-CM

## 2017-03-10 DIAGNOSIS — Y939 Activity, unspecified: Secondary | ICD-10-CM | POA: Diagnosis not present

## 2017-03-10 DIAGNOSIS — S59911A Unspecified injury of right forearm, initial encounter: Secondary | ICD-10-CM | POA: Diagnosis present

## 2017-03-10 MED ORDER — NAPROXEN 250 MG PO TABS
500.0000 mg | ORAL_TABLET | Freq: Once | ORAL | Status: AC
  Administered 2017-03-10: 500 mg via ORAL
  Filled 2017-03-10: qty 2

## 2017-03-10 NOTE — ED Notes (Signed)
Patient transported to X-ray 

## 2017-03-10 NOTE — ED Triage Notes (Signed)
She tripped and fell. Injury to her right wrist with deformity noted. Radial pulse palpated and strong.

## 2017-03-11 NOTE — ED Notes (Signed)
ED Provider at bedside. 

## 2017-03-11 NOTE — ED Provider Notes (Addendum)
[facility] Provider Note: Rachel Spurling, MD, FACEP  CSN: [id number] MRN: [id number] ARRIVAL: 03/10/17 at [place]: [place]  Wrist Injury   HISTORY OF PRESENT ILLNESS  03/11/17 12:47 AM Rachel Fitzpatrick is a 57 y.o. female who fell about an hour prior to arrival.  She fell onto her outstretched right hand.  She is having pain, swelling and ecchymosis overlying her distal right radius.  She rates her pain as a 7 out of 10, worse with palpation or movement.  She has some numbness distally but no functional deficit.  She denies other injury.  She is an established patient of Dr. Iran Planas who has operated on her left wrist.  She was recently diagnosed with osteoporosis and had a rib fracture occur last week.  Past Medical History:  Diagnosis Date  . Adenomatous colon polyp   . Allergy   . Anxiety   . Arthritis   . Blood in stool   . Complication of anesthesia    Reports hypotension with anesthesia  . GERD (gastroesophageal reflux disease)   . Hypercholesteremia   . Osteopenia     Past Surgical History:  Procedure Laterality Date  . APPENDECTOMY    . COLONOSCOPY    . EYE SURGERY    . FOOT SURGERY    . HAND SURGERY     tendon repair  . ORIF WRIST FRACTURE Left 05/03/2016   Procedure: OPEN REDUCTION INTERNAL FIXATION (ORIF) WRIST FRACTURE/LEFT;  Surgeon: Iran Planas, MD;  Location: [facility];  Service: Orthopedics;  Laterality: Left;  . RECTAL POLYPECTOMY      Family History  Problem Relation Age of Onset  . Hyperlipidemia Mother   . Cancer Mother        lung cancer  . Hyperlipidemia Father   . Diabetes Father   . COPD Father   . Sudden death Neg Hx   . Heart attack Neg Hx   . Hypertension Neg Hx     Social History   Tobacco Use  . Smoking status: Never Smoker  . Smokeless tobacco: Never Used  Substance Use Topics  . Alcohol use: Yes    Comment: Weekends; 2-3 beverages; Social.  . Drug use: No    Prior to Admission  medications   Medication Sig Start Date End Date Taking? Authorizing Provider  acetaminophen (TYLENOL) 500 MG tablet Take 1,000 mg by mouth every 6 (six) hours as needed for moderate pain.    [provider]  Calcium Citrate-Vitamin D (CALCIUM + D PO) Take 1 tablet by mouth 2 (two) times daily.    [provider]  cetirizine (ZYRTEC) 10 MG tablet Take 1 tablet (10 mg total) by mouth daily. Patient taking differently: Take 10 mg by mouth daily as needed for allergies.  10/02/14   Midge Minium, MD  cholecalciferol (VITAMIN D) 1000 UNITS tablet Take 1,000 Units by mouth daily.    [provider]  cyclobenzaprine (FLEXERIL) 10 MG tablet Take 1 tablet (10 mg total) by mouth 3 (three) times daily as needed for muscle spasms. Patient taking differently: Take 10 mg by mouth at bedtime as needed for muscle spasms.  11/20/15   Midge Minium, MD  cycloSPORINE (RESTASIS) 0.05 % ophthalmic emulsion Place 1 drop into both eyes 2 (two) times daily.    [provider]  fluticasone (FLONASE) 50 MCG/ACT nasal spray Place 2 sprays into both nostrils daily. Patient taking differently: Place 2 sprays into both nostrils daily  as needed for allergies.  10/02/14   Midge Minium, MD  Glucosamine-Chondroit-Vit C-Mn (GLUCOSAMINE CHONDR 1500 COMPLX) CAPS Take 1 capsule by mouth 2 (two) times daily.    [provider]  HYDROcodone-acetaminophen (NORCO/VICODIN) 5-325 MG tablet Take 1-2 tablets by mouth every 4 (four) hours as needed. Patient taking differently: Take 1-2 tablets by mouth every 4 (four) hours as needed for moderate pain.  04/24/16   Doristine Devoid, PA-C  MELATONIN PO Take 1 tablet by mouth at bedtime as needed (sleep).    [provider]  meloxicam (MOBIC) 15 MG tablet Take 15 mg by mouth daily as needed for pain.    [provider]  omeprazole (PRILOSEC) 20 MG capsule Take 20 mg by mouth daily as needed (heartburn).    [provider]  Polyvinyl Alcohol (LUBRICANT DROPS OP) Apply 1 drop to eye 4 (four) times daily as needed (dry eyes).    [provider]  rosuvastatin (CRESTOR) 20 MG tablet TAKE 1 TABLET BY MOUTH ONCE A DAY FOR CHOLESTEROL 08/20/16   Midge Minium, MD  sertraline (ZOLOFT) 100 MG tablet Take 200 mg by mouth daily.     [provider]    Allergies No known allergies   REVIEW OF SYSTEMS  Negative except as noted here or in the History of Present Illness.   PHYSICAL EXAMINATION  Initial Vital Signs Blood pressure (!) 120/99, pulse 79, temperature 97.7 F (36.5 C), temperature source Oral, resp. rate 20, height 5\' 2"  (1.575 m), weight 67.1 kg (148 lb), SpO2 100 %.  Examination General: Well-developed, well-nourished female in no acute distress; appearance consistent with age of record HENT: normocephalic; atraumatic Eyes: pupils equal, round and reactive to light; extraocular muscles intact Neck: supple; no C-spine tenderness Heart: regular rate and rhythm Lungs: clear to auscultation bilaterally Abdomen: soft; nondistended; nontender; bowel sounds present Extremities: Arthritic changes; swelling, tenderness and ecchymosis overlying right distal radius, right hand distally neurovascularly intact with intact tendon function except for subjectively altered sensation Neurologic: Awake, alert and oriented; motor function intact in all extremities and symmetric; no facial droop Skin: Warm and dry Psychiatric: Normal mood and affect   RESULTS  Summary of this visit's results, reviewed by myself:   EKG Interpretation  Date/Time:    Ventricular Rate:    PR Interval:    QRS Duration:   QT Interval:    QTC Calculation:   R Axis:     Text Interpretation:        Laboratory Studies: No results found for this or any previous visit (from the past 24 hour(s)). Imaging Studies: Dg Wrist Complete Right  Result Date: 03/10/2017 CLINICAL DATA:  57 year old female  with fall and right wrist pain. EXAM: RIGHT WRIST - COMPLETE 3+ VIEW COMPARISON:  None. FINDINGS: Faint linear lucency through the dorsal aspect of the distal radius, seen on the lateral view, possibly represents a vascular groove. A nondisplaced fracture is not entirely excluded. Clinical correlation is recommended no other acute fracture identified. There is no dislocation. There is mild osteopenia. There is soft tissue swelling of the dorsal aspect of the wrist. No radiopaque foreign object. IMPRESSION: 1. Vascular groove versus less likely a nondisplaced fracture of the dorsal cortex of the distal radius. 2.  Soft tissue swelling of the dorsum of the wrist. Electronically Signed   By: Anner Crete M.D.   On: 03/10/2017 23:41   Clinically there is a high suspicion for fracture.  We will splint her right  wrist and have her follow-up with Dr. Caralyn Guile in about a week.  She has an appointment with her PCP in 4 days to discuss treatment of recently diagnosed osteoporosis.  ED COURSE  Nursing notes and initial vitals signs, including pulse oximetry, reviewed.  Vitals:   03/10/17 2245  BP: (!) 120/99  Pulse: 79  Resp: 20  Temp: 97.7 F (36.5 C)  TempSrc: Oral  SpO2: 100%  Weight: 67.1 kg (148 lb)  Height: 5\' 2"  (1.575 m)    PROCEDURES    ED DIAGNOSES     ICD-10-CM   1. Fall in home, initial encounter W19.XXXA    Y92.009   2. Other closed fracture of distal end of right radius, initial encounter S52.591A        Aeneas Longsworth, Jenny Reichmann, MD 03/11/17 9470    Shanon Rosser, MD 03/11/17 0100

## 2017-03-15 ENCOUNTER — Other Ambulatory Visit: Payer: Self-pay

## 2017-03-15 ENCOUNTER — Encounter: Payer: Self-pay | Admitting: Family Medicine

## 2017-03-15 ENCOUNTER — Ambulatory Visit (INDEPENDENT_AMBULATORY_CARE_PROVIDER_SITE_OTHER): Payer: Managed Care, Other (non HMO) | Admitting: Family Medicine

## 2017-03-15 VITALS — BP 121/81 | HR 79 | Resp 16 | Ht 61.0 in | Wt 151.2 lb

## 2017-03-15 DIAGNOSIS — M7061 Trochanteric bursitis, right hip: Secondary | ICD-10-CM | POA: Diagnosis not present

## 2017-03-15 DIAGNOSIS — M8000XA Age-related osteoporosis with current pathological fracture, unspecified site, initial encounter for fracture: Secondary | ICD-10-CM

## 2017-03-15 DIAGNOSIS — M810 Age-related osteoporosis without current pathological fracture: Secondary | ICD-10-CM | POA: Insufficient documentation

## 2017-03-15 MED ORDER — ALENDRONATE SODIUM 70 MG PO TABS: 70.0000 mg | ORAL_TABLET | ORAL | 11 refills | Status: DC

## 2017-03-15 NOTE — Assessment & Plan Note (Signed)
Pt has DEXA from May that shows Tscore of -3.7.  Ortho attempted to order Prolia for pt but this was declined by insurance b/c she has not been on a bisphosphonate.  Will start Prolia in addition to her daily calcium and Vit D.  Encouraged lifting to improve bone health.  Will follow.

## 2017-03-15 NOTE — Progress Notes (Signed)
   Subjective:    Patient ID: Rachel Fitzpatrick, female    DOB: 19-Jan-1961, 57 y.o.   MRN: [id number]  HPI Osteoporosis- pt's BMD from 07/02/16 shows osteoporosis (-3.7 at femur).  Pt has had multiple falls resulting in fractures- most recently last week her R rib.  Dr Angus Palms office (ortho) tried to order Prolia but this was declined due to lack of bisphosphonate use.  On Ca and Vit D daily.  Pt has hx of GERD.  R trochanteric bursitis- painful to touch R lateral hip, unable to lie on R side at night.   Review of Systems For ROS see HPI     Objective:   Physical Exam  Constitutional: She is oriented to person, place, and time. She appears well-developed and well-nourished. No distress.  HENT:  Head: Normocephalic and atraumatic.  Musculoskeletal: She exhibits tenderness (TTP over R greater trochanteric bursa). She exhibits no deformity.  Neurological: She is alert and oriented to person, place, and time.  Skin: Skin is warm and dry. No rash noted. No erythema.  Psychiatric: She has a normal mood and affect. Her behavior is normal. Thought content normal.  Vitals reviewed.         Assessment & Plan:  Trochanteric Bursitis- new.  Reviewed dx and tx w/ pt.  Start NSAIDs and ice.  If no improvement, pt to call Ortho.  Pt expressed understanding and is in agreement w/ plan.

## 2017-03-15 NOTE — Patient Instructions (Signed)
Follow up as scheduled for your physical START the Fosamax weekly Continue your daily Calcium and Vit D Try and add lifting to your workout regimen to improve bone health If you are not able to tolerate the Fosamax, let me know and we'll proceed w/ the Prolia Take the Meloxicam and ice the hip for the trochanteric bursitis Call with any questions or concerns Hang in there!!

## 2017-04-01 ENCOUNTER — Other Ambulatory Visit: Payer: Self-pay | Admitting: General Practice

## 2017-04-01 ENCOUNTER — Telehealth: Payer: Self-pay | Admitting: Family Medicine

## 2017-04-01 MED ORDER — ALENDRONATE SODIUM 70 MG PO TABS: 70.0000 mg | ORAL_TABLET | ORAL | 3 refills | Status: DC

## 2017-04-01 NOTE — Telephone Encounter (Signed)
Copied from [place] [id number]. Topic: Quick Communication - See Telephone Encounter >> Apr 01, 2017  4:52 PM Vernona Rieger wrote: CRM for notification. See Telephone encounter for:   04/01/17.  Patient said Dr Birdie Riddle only gave her 4 pills for the alendronate (FOSAMAX) 70 MG tablet. She said she likes it & wants a 90 day supply for it due to her insurance.  [id number] [address], [place]

## 2017-04-03 ENCOUNTER — Other Ambulatory Visit: Payer: Self-pay | Admitting: Family Medicine

## 2017-05-23 LAB — HM PAP SMEAR

## 2017-06-02 ENCOUNTER — Ambulatory Visit (INDEPENDENT_AMBULATORY_CARE_PROVIDER_SITE_OTHER): Payer: Managed Care, Other (non HMO) | Admitting: Family Medicine

## 2017-06-02 ENCOUNTER — Encounter: Payer: Self-pay | Admitting: Family Medicine

## 2017-06-02 ENCOUNTER — Other Ambulatory Visit: Payer: Self-pay

## 2017-06-02 VITALS — BP 122/82 | HR 84 | Temp 98.1°F | Resp 16 | Ht 61.0 in | Wt 154.5 lb

## 2017-06-02 DIAGNOSIS — M810 Age-related osteoporosis without current pathological fracture: Secondary | ICD-10-CM

## 2017-06-02 DIAGNOSIS — Z0000 Encounter for general adult medical examination without abnormal findings: Secondary | ICD-10-CM | POA: Diagnosis not present

## 2017-06-02 DIAGNOSIS — E785 Hyperlipidemia, unspecified: Secondary | ICD-10-CM

## 2017-06-02 NOTE — Assessment & Plan Note (Signed)
Chronic problem.  On Crestor w/o difficulty.  Check labs.  Adjust meds prn  

## 2017-06-02 NOTE — Patient Instructions (Signed)
Follow up in 6 months to recheck cholesterol Let me know via MyChart in the next 1-2 weeks if the hip pain is better w/ daily Meloxicam (take w/ food) We'll notify you of your lab results and make any changes if needed Continue to work on healthy diet and regular exercise- you can do it!!! Call with any questions or concerns [place]!!!

## 2017-06-02 NOTE — Assessment & Plan Note (Signed)
Chronic problem.  On Fosamax w/o difficulty.  Check Vit D and replete prn.

## 2017-06-02 NOTE — Assessment & Plan Note (Signed)
Pt's PE WNL w/ exception of being overweight.  UTD on colonoscopy, mammo, DEXA, and immunizations.  Due for pap- plans to schedule.  Check labs.  Anticipatory guidance provided.

## 2017-06-02 NOTE — Progress Notes (Signed)
   Subjective:    Patient ID: Rachel Fitzpatrick, female    DOB: 1960/10/27, 57 y.o.   MRN: [id number]  HPI CPE- due for pap w/ Dr Matthew Saras, UTD on mammo and colonoscopy.  UTD on DEXA and immunizations.  Bilateral hip pain- R>L, pain is lateral in area of bursitis.  Unable to lie on at night w/o pain.  Not taking Meloxicam regularly.  Review of Systems Patient reports no vision/ hearing changes, adenopathy,fever, weight change,  persistant/recurrent hoarseness , swallowing issues, chest pain, palpitations, edema, persistant/recurrent cough, hemoptysis, dyspnea (rest/exertional/paroxysmal nocturnal), gastrointestinal bleeding (melena, rectal bleeding), abdominal pain, significant heartburn, bowel changes, GU symptoms (dysuria, hematuria, incontinence), Gyn symptoms (abnormal  bleeding, pain),  syncope, focal weakness, memory loss, numbness & tingling, skin/hair/nail changes, abnormal bruising or bleeding, anxiety, or depression.     Objective:   Physical Exam General Appearance:    Alert, cooperative, no distress, appears stated age  Head:    Normocephalic, without obvious abnormality, atraumatic  Eyes:    PERRL, conjunctiva/corneas clear, EOM's intact, fundi    benign, both eyes  Ears:    Normal TM's and external ear canals, both ears  Nose:   Nares normal, septum midline, mucosa normal, no drainage    or sinus tenderness  Throat:   Lips, mucosa, and tongue normal; teeth and gums normal  Neck:   Supple, symmetrical, trachea midline, no adenopathy;    Thyroid: no enlargement/tenderness/nodules  Back:     Symmetric, no curvature, ROM normal, no CVA tenderness  Lungs:     Clear to auscultation bilaterally, respirations unlabored  Chest Wall:    No tenderness or deformity   Heart:    Regular rate and rhythm, S1 and S2 normal, no murmur, rub   or gallop  Breast Exam:    Deferred to GYN  Abdomen:     Soft, non-tender, bowel sounds active all four quadrants,    no masses, no organomegaly    Genitalia:    Deferred to GYN  Rectal:    Extremities:   Extremities normal, atraumatic, no cyanosis or edema  Pulses:   2+ and symmetric all extremities  Skin:   Skin color, texture, turgor normal, no rashes or lesions  Lymph nodes:   Cervical, supraclavicular, and axillary nodes normal  Neurologic:   CNII-XII intact, normal strength, sensation and reflexes    throughout          Assessment & Plan:

## 2017-06-06 ENCOUNTER — Other Ambulatory Visit (INDEPENDENT_AMBULATORY_CARE_PROVIDER_SITE_OTHER): Payer: Managed Care, Other (non HMO)

## 2017-06-06 DIAGNOSIS — E785 Hyperlipidemia, unspecified: Secondary | ICD-10-CM | POA: Diagnosis not present

## 2017-06-07 LAB — TSH: TSH: 2.02 mIU/L

## 2017-06-07 LAB — CBC WITH DIFFERENTIAL/PLATELET
BASOS ABS: 32 {cells}/uL (ref 0–200)
BASOS PCT: 0.7 %
EOS ABS: 131 {cells}/uL (ref 15–500)
Eosinophils Relative: 2.9 %
HCT: 36.6 % (ref 35.0–45.0)
HEMOGLOBIN: 12.6 g/dL (ref 11.7–15.5)
Lymphs Abs: 2241 cells/uL (ref 850–3900)
MCH: 31 pg (ref 27.0–33.0)
MCHC: 34.4 g/dL (ref 32.0–36.0)
MCV: 90.1 fL (ref 80.0–100.0)
MPV: 11.7 fL (ref 7.5–12.5)
Monocytes Relative: 8.9 %
Neutro Abs: 1697 cells/uL (ref 1500–7800)
Neutrophils Relative %: 37.7 %
PLATELETS: 180 10*3/uL (ref 140–400)
RBC: 4.06 10*6/uL (ref 3.80–5.10)
RDW: 12.6 % (ref 11.0–15.0)
TOTAL LYMPHOCYTE: 49.8 %
WBC: 4.5 10*3/uL (ref 3.8–10.8)
WBCMIX: 401 {cells}/uL (ref 200–950)

## 2017-06-07 LAB — LIPID PANEL
CHOLESTEROL: 234 mg/dL — AB (ref <200)
HDL: 97 mg/dL (ref >50)
LDL CHOLESTEROL (CALC): 118 mg/dL — AB
Non-HDL Cholesterol (Calc): 137 mg/dL (calc) — ABNORMAL HIGH (ref <130)
TRIGLYCERIDES: 89 mg/dL (ref <150)
Total CHOL/HDL Ratio: 2.4 (calc) (ref <5.0)

## 2017-06-07 LAB — HEPATIC FUNCTION PANEL
AG RATIO: 1.6 (calc) (ref 1.0–2.5)
ALKALINE PHOSPHATASE (APISO): 45 U/L (ref 33–130)
ALT: 9 U/L (ref 6–29)
AST: 23 U/L (ref 10–35)
Albumin: 4.1 g/dL (ref 3.6–5.1)
BILIRUBIN DIRECT: 0.1 mg/dL (ref 0.0–0.2)
BILIRUBIN TOTAL: 0.4 mg/dL (ref 0.2–1.2)
Globulin: 2.5 g/dL (calc) (ref 1.9–3.7)
Indirect Bilirubin: 0.3 mg/dL (calc) (ref 0.2–1.2)
Total Protein: 6.6 g/dL (ref 6.1–8.1)

## 2017-06-07 LAB — BASIC METABOLIC PANEL
BUN / CREAT RATIO: 35 (calc) — AB (ref 6–22)
BUN: 27 mg/dL — ABNORMAL HIGH (ref 7–25)
CALCIUM: 8.8 mg/dL (ref 8.6–10.4)
CHLORIDE: 104 mmol/L (ref 98–110)
CO2: 23 mmol/L (ref 20–32)
Creat: 0.78 mg/dL (ref 0.50–1.05)
GLUCOSE: 93 mg/dL (ref 65–99)
POTASSIUM: 4.6 mmol/L (ref 3.5–5.3)
SODIUM: 137 mmol/L (ref 135–146)

## 2017-06-07 LAB — VITAMIN D 25 HYDROXY (VIT D DEFICIENCY, FRACTURES): Vit D, 25-Hydroxy: 46 ng/mL (ref 30–100)

## 2017-09-15 ENCOUNTER — Encounter: Payer: Self-pay | Admitting: Family Medicine

## 2017-09-15 MED ORDER — SERTRALINE HCL 100 MG PO TABS: 200.0000 mg | ORAL_TABLET | Freq: Every day | ORAL | 6 refills | Status: DC

## 2017-09-15 NOTE — Telephone Encounter (Signed)
Last OV 06/02/17 zoloft shows on med list but does not have your name on it. Please advise?

## 2017-10-08 ENCOUNTER — Other Ambulatory Visit: Payer: Self-pay | Admitting: Family Medicine

## 2017-11-14 ENCOUNTER — Other Ambulatory Visit: Payer: Self-pay | Admitting: Family Medicine

## 2017-11-14 NOTE — Telephone Encounter (Signed)
Received and reviewed medication refill request.  Request is appropriate and was approved.  Please see medication orders for details.  

## 2017-11-30 ENCOUNTER — Ambulatory Visit (INDEPENDENT_AMBULATORY_CARE_PROVIDER_SITE_OTHER): Payer: Managed Care, Other (non HMO) | Admitting: Family Medicine

## 2017-11-30 ENCOUNTER — Encounter: Payer: Self-pay | Admitting: Family Medicine

## 2017-11-30 ENCOUNTER — Other Ambulatory Visit: Payer: Self-pay

## 2017-11-30 VITALS — BP 116/82 | HR 72 | Temp 98.6°F | Resp 16 | Ht 61.0 in | Wt 160.2 lb

## 2017-11-30 DIAGNOSIS — E785 Hyperlipidemia, unspecified: Secondary | ICD-10-CM

## 2017-11-30 DIAGNOSIS — D225 Melanocytic nevi of trunk: Secondary | ICD-10-CM | POA: Diagnosis not present

## 2017-11-30 DIAGNOSIS — E669 Obesity, unspecified: Secondary | ICD-10-CM | POA: Insufficient documentation

## 2017-11-30 LAB — HEPATIC FUNCTION PANEL
ALBUMIN: 4.5 g/dL (ref 3.5–5.2)
ALK PHOS: 43 U/L (ref 39–117)
ALT: 10 U/L (ref 0–35)
AST: 22 U/L (ref 0–37)
BILIRUBIN TOTAL: 0.5 mg/dL (ref 0.2–1.2)
Bilirubin, Direct: 0.1 mg/dL (ref 0.0–0.3)
Total Protein: 7.3 g/dL (ref 6.0–8.3)

## 2017-11-30 LAB — LIPID PANEL
CHOL/HDL RATIO: 2
Cholesterol: 267 mg/dL — ABNORMAL HIGH (ref 0–200)
HDL: 110.5 mg/dL (ref >39.00)
LDL Cholesterol: 143 mg/dL — ABNORMAL HIGH (ref 0–99)
NONHDL: 156.97
Triglycerides: 71 mg/dL (ref 0.0–149.0)
VLDL: 14.2 mg/dL (ref 0.0–40.0)

## 2017-11-30 NOTE — Assessment & Plan Note (Signed)
Chronic problem, on Crestor w/o difficulty.  Check labs.  Adjust meds prn

## 2017-11-30 NOTE — Assessment & Plan Note (Signed)
New.  With pt's recent 5 lb weight gain her BMI is now 30.3  Encouraged her to continue healthy diet and regular exercise.  Will follow.

## 2017-11-30 NOTE — Progress Notes (Signed)
   Subjective:    Patient ID: Rachel Fitzpatrick, female    DOB: 1960/07/24, 57 y.o.   MRN: [id number]  HPI Hyperlipidemia- chronic problem, on Crestor 20mg  daily.  Pt is back to exercising after 'falling off the wagon for a bit'.  Is exercising 3-4x/week + walking.  No CP, SOB, HAs, visual changes, abd pain, N/V.  Obesity- ongoing issue for pt.  She has gained 5 lbs since last visit.  BMI now 30.3  Mole- noticed just last week that a mole on her trunk was changing.  Has red area around it.  Has skin screening later this month.   Review of Systems For ROS see HPI     Objective:   Physical Exam  Constitutional: She is oriented to person, place, and time. She appears well-developed and well-nourished. No distress.  HENT:  Head: Normocephalic and atraumatic.  Eyes: Pupils are equal, round, and reactive to light. Conjunctivae and EOM are normal.  Neck: Normal range of motion. Neck supple. No thyromegaly present.  Cardiovascular: Normal rate, regular rhythm, normal heart sounds and intact distal pulses.  No murmur heard. Pulmonary/Chest: Effort normal and breath sounds normal. No respiratory distress.  Abdominal: Soft. She exhibits no distension. There is no tenderness.  Musculoskeletal: She exhibits no edema.  Lymphadenopathy:    She has no cervical adenopathy.  Neurological: She is alert and oriented to person, place, and time.  Skin: Skin is warm and dry.  Normal appearing nevus on L trunk at waistband  Psychiatric: She has a normal mood and affect. Her behavior is normal.  Vitals reviewed.         Assessment & Plan:  Mole on trunk- not irritated today.  Normal appearing w/o concerning features.  Pt encouraged to monitor.

## 2017-11-30 NOTE — Patient Instructions (Signed)
Schedule your complete physical in 6 months We'll notify you of your lab results and make any changes Continue to work on healthy diet and regular exercise- you can do it! The mole looks ok today but continue to monitor Call with any questions or concerns Happy [place]!!!

## 2017-12-01 ENCOUNTER — Encounter: Payer: Self-pay | Admitting: General Practice

## 2017-12-05 ENCOUNTER — Encounter: Payer: Self-pay | Admitting: Family Medicine

## 2018-03-23 ENCOUNTER — Other Ambulatory Visit: Payer: Self-pay | Admitting: Family Medicine

## 2018-03-27 ENCOUNTER — Encounter: Payer: Self-pay | Admitting: Family Medicine

## 2018-03-27 ENCOUNTER — Ambulatory Visit (INDEPENDENT_AMBULATORY_CARE_PROVIDER_SITE_OTHER): Payer: Managed Care, Other (non HMO) | Admitting: Family Medicine

## 2018-03-27 ENCOUNTER — Other Ambulatory Visit: Payer: Self-pay

## 2018-03-27 VITALS — BP 124/90 | HR 94 | Temp 100.0°F | Resp 17 | Ht 61.0 in | Wt 167.0 lb

## 2018-03-27 DIAGNOSIS — R6889 Other general symptoms and signs: Secondary | ICD-10-CM

## 2018-03-27 DIAGNOSIS — R05 Cough: Secondary | ICD-10-CM

## 2018-03-27 DIAGNOSIS — R059 Cough, unspecified: Secondary | ICD-10-CM

## 2018-03-27 LAB — POCT INFLUENZA A/B
Influenza A, POC: NEGATIVE
Influenza B, POC: NEGATIVE

## 2018-03-27 MED ORDER — ALBUTEROL SULFATE (2.5 MG/3ML) 0.083% IN NEBU
2.5000 mg | INHALATION_SOLUTION | Freq: Once | RESPIRATORY_TRACT | Status: AC
  Administered 2018-03-27: 2.5 mg via RESPIRATORY_TRACT

## 2018-03-27 MED ORDER — GUAIFENESIN-CODEINE 100-10 MG/5ML PO SYRP: 10.0000 mL | ORAL_SOLUTION | Freq: Three times a day (TID) | ORAL | 0 refills | Status: DC | PRN

## 2018-03-27 MED ORDER — ALBUTEROL SULFATE HFA 108 (90 BASE) MCG/ACT IN AERS: 2.0000 | INHALATION_SPRAY | Freq: Four times a day (QID) | RESPIRATORY_TRACT | 2 refills | Status: DC | PRN

## 2018-03-27 NOTE — Patient Instructions (Signed)
Follow up as needed or as scheduled START the Albuterol inhaler- 2 puffs every 4 hrs as needed for cough, shortness of breath/wheezing USE the cough syrup as needed- may cause drowsiness Delsym as needed for daytime cough w/o drowsiness Drink plenty of fluids! REST! Alternate tylenol/ibuprofen every 4 hrs for fever or body aches Call with any questions or concerns Hang in there!!

## 2018-03-27 NOTE — Progress Notes (Signed)
   Subjective:    Patient ID: Rachel Fitzpatrick, female    DOB: Sep 04, 1960, 58 y.o.   MRN: [id number]  HPI Flu like sxs- sxs started yesterday very suddenly w/ chills, nausea, cough, HA.  No nausea today.  + body aches.  Cough is dry, barking.  Skin is not sensitive.  Denies sinus pain/pressure.  No congestion.  No known sick contacts.   Review of Systems For ROS see HPI     Objective:   Physical Exam Vitals signs reviewed.  Constitutional:      Appearance: She is well-developed. She is ill-appearing.  HENT:     Head: Normocephalic and atraumatic.     Right Ear: Tympanic membrane normal.     Left Ear: Tympanic membrane normal.     Nose:     Comments: No TTP over frontal or maxillary sinuses Eyes:     Conjunctiva/sclera: Conjunctivae normal.     Pupils: Pupils are equal, round, and reactive to light.  Neck:     Musculoskeletal: Normal range of motion and neck supple.  Cardiovascular:     Rate and Rhythm: Normal rate and regular rhythm.     Heart sounds: Normal heart sounds. No murmur.  Pulmonary:     Effort: Pulmonary effort is normal. No respiratory distress.     Breath sounds: Normal breath sounds. No wheezing.     Comments: + hacking cough Lymphadenopathy:     Cervical: No cervical adenopathy.  Neurological:     General: No focal deficit present.     Mental Status: She is oriented to person, place, and time.           Assessment & Plan:  Cough- new.  Pt's sxs started suddenly but denies severe body aches or skin sensitivity that would suggest flu.  Rapid flu test negative.  Pt's biggest complaint is cough and the soreness in chest and back from near continuous cough.  Cough dramatically improved s/p albuterol tx.  Start Albuterol HFA prn.  Cough meds prn.  .Reviewed supportive care and red flags that should prompt return.  Pt expressed understanding and is in agreement w/ plan.

## 2018-04-07 ENCOUNTER — Other Ambulatory Visit (HOSPITAL_BASED_OUTPATIENT_CLINIC_OR_DEPARTMENT_OTHER): Payer: Self-pay | Admitting: Family Medicine

## 2018-04-07 DIAGNOSIS — Z1231 Encounter for screening mammogram for malignant neoplasm of breast: Secondary | ICD-10-CM

## 2018-04-17 ENCOUNTER — Ambulatory Visit (HOSPITAL_BASED_OUTPATIENT_CLINIC_OR_DEPARTMENT_OTHER)
Admission: RE | Admit: 2018-04-17 | Discharge: 2018-04-17 | Disposition: A | Discharge status: Home / Self Care | Payer: Managed Care, Other (non HMO) | Source: Ambulatory Visit | Attending: Family Medicine | Admitting: Family Medicine

## 2018-04-17 ENCOUNTER — Encounter (HOSPITAL_BASED_OUTPATIENT_CLINIC_OR_DEPARTMENT_OTHER): Payer: Self-pay

## 2018-04-17 DIAGNOSIS — Z1231 Encounter for screening mammogram for malignant neoplasm of breast: Secondary | ICD-10-CM | POA: Insufficient documentation

## 2018-04-18 ENCOUNTER — Ambulatory Visit (HOSPITAL_BASED_OUTPATIENT_CLINIC_OR_DEPARTMENT_OTHER): Payer: Managed Care, Other (non HMO)

## 2018-04-19 ENCOUNTER — Other Ambulatory Visit: Payer: Self-pay | Admitting: Family Medicine

## 2018-04-19 DIAGNOSIS — R928 Other abnormal and inconclusive findings on diagnostic imaging of breast: Secondary | ICD-10-CM

## 2018-04-21 ENCOUNTER — Ambulatory Visit: Payer: Managed Care, Other (non HMO)

## 2018-04-21 ENCOUNTER — Ambulatory Visit
Admission: RE | Admit: 2018-04-21 | Discharge: 2018-04-21 | Disposition: A | Discharge status: Home / Self Care | Payer: Managed Care, Other (non HMO) | Source: Ambulatory Visit | Attending: Family Medicine | Admitting: Family Medicine

## 2018-04-21 DIAGNOSIS — R928 Other abnormal and inconclusive findings on diagnostic imaging of breast: Secondary | ICD-10-CM

## 2018-04-25 ENCOUNTER — Other Ambulatory Visit: Payer: Managed Care, Other (non HMO)

## 2018-05-11 ENCOUNTER — Other Ambulatory Visit: Payer: Self-pay | Admitting: General Practice

## 2018-05-11 MED ORDER — SERTRALINE HCL 100 MG PO TABS: 200.0000 mg | ORAL_TABLET | Freq: Every day | ORAL | 1 refills | Status: DC

## 2018-06-13 ENCOUNTER — Encounter: Payer: Self-pay | Admitting: Family Medicine

## 2018-08-12 ENCOUNTER — Encounter (HOSPITAL_COMMUNITY): Payer: Self-pay | Admitting: Emergency Medicine

## 2018-08-12 ENCOUNTER — Ambulatory Visit (INDEPENDENT_AMBULATORY_CARE_PROVIDER_SITE_OTHER): Payer: Managed Care, Other (non HMO)

## 2018-08-12 ENCOUNTER — Other Ambulatory Visit: Payer: Self-pay

## 2018-08-12 ENCOUNTER — Ambulatory Visit (HOSPITAL_COMMUNITY)
Admission: EM | Admit: 2018-08-12 | Discharge: 2018-08-12 | Disposition: A | Discharge status: Home / Self Care | Payer: Managed Care, Other (non HMO) | Attending: Emergency Medicine | Admitting: Emergency Medicine

## 2018-08-12 DIAGNOSIS — S93492A Sprain of other ligament of left ankle, initial encounter: Secondary | ICD-10-CM | POA: Diagnosis not present

## 2018-08-12 NOTE — ED Triage Notes (Signed)
Per pt she had fallen yesterday on her leg and twisted her left ankle. Can walk but she noticed to day was more swollen.

## 2018-08-12 NOTE — Discharge Instructions (Signed)
Negative for any fractures today.  Consistent with ankle sprain.  Ice, elevation, antiinflammatories as needed for pain.  Activity as tolerated.  See exercises provided.  Follow up with your orthopedist and/or PCP as needed for persistent symptoms.

## 2018-08-12 NOTE — ED Provider Notes (Signed)
[place]    CSN: [id number] Arrival date & time: 08/12/18  1020     History   Chief Complaint Chief Complaint  Patient presents with  . Ankle Injury    HPI Rachel Fitzpatrick is a 58 y.o. female.   Rachel Fitzpatrick presents with complaints of  Left ankle pain and swelling after she stepped down on the edge of stone yesterday, causing it to evert and her to fall. Was ambulatory after incident but pain and swelling has increased. Denies any previous ankle injuries or surgeries. Hx of osteoporosis with multiple other fractures in the past, however. No numbness or tingling. She is ambulatory but pain with weight bearing. Took ibuprofen which did help some. Pain 7/10 in severity. No other injuries associated with fall.     ROS per HPI, negative if not otherwise mentioned.      Past Medical History:  Diagnosis Date  . Adenomatous colon polyp   . Allergy   . Anxiety   . Arthritis   . Blood in stool   . Complication of anesthesia    Reports hypotension with anesthesia  . GERD (gastroesophageal reflux disease)   . Hypercholesteremia   . Osteopenia     Patient Active Problem List   Diagnosis Date Noted  . Obesity (BMI 30.0-34.9) 11/30/2017  . Osteoporosis 03/15/2017  . Distal radius fracture, left 05/03/2016  . Rib pain on right side 02/20/2015  . Physical exam 11/08/2014  . Allergic rhinitis 10/02/2014  . Chest pressure 10/02/2014  . Hyperlipidemia 05/08/2014  . Anxiety state 05/08/2014  . Abscess of right breast 05/08/2014  . Abnormal cervical Papanicolaou smear 05/08/2014  . Rectal bleeding 05/08/2014  . Skin cancer screening 05/08/2014  . Right knee pain 09/14/2013  . Stress fracture of calcaneus 01/17/2013  . 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 Bunion of great toe 01/17/2013  . Left foot pain 12/08/2012  . 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 APPENDECTOMY    . COLONOSCOPY    . EYE SURGERY     . FOOT SURGERY    . HAND SURGERY     tendon repair  . ORIF WRIST FRACTURE Left 05/03/2016   Procedure: OPEN REDUCTION INTERNAL FIXATION (ORIF) WRIST FRACTURE/LEFT;  Surgeon: Iran Planas, MD;  Location: [facility];  Service: Orthopedics;  Laterality: Left;  . RECTAL POLYPECTOMY      OB History   No obstetric history on file.      Home Medications    Prior to Admission medications   Medication Sig Start Date End Date Taking? Authorizing Provider  acetaminophen (TYLENOL) 500 MG tablet Take 1,000 mg by mouth every 6 (six) hours as needed for moderate pain.    [provider]  albuterol (PROVENTIL HFA;VENTOLIN HFA) 108 (90 Base) MCG/ACT inhaler Inhale 2 puffs into the lungs every 6 (six) hours as needed for wheezing or shortness of breath. 03/27/18   Midge Minium, MD  alendronate (FOSAMAX) 70 MG tablet TAKE 1 TABLET BY MOUTH EVERY 7 DAYS. TAKE WITH A FULL GLASS OF WATER ON AN EMPTY STOMACH. 03/23/18   Midge Minium, MD  Calcium Citrate-Vitamin D (CALCIUM + D PO) Take 1 tablet by mouth 2 (two) times daily.    [provider]  cetirizine (ZYRTEC) 10 MG tablet Take 1 tablet (10 mg total) by mouth daily. Patient taking differently: Take 10 mg by mouth daily as needed for allergies.  10/02/14   Birdie Riddle,  Aundra Millet, MD  cholecalciferol (VITAMIN D) 1000 UNITS tablet Take 1,000 Units by mouth daily.    [provider]  guaiFENesin-codeine (ROBITUSSIN AC) 100-10 MG/5ML syrup Take 10 mLs by mouth 3 (three) times daily as needed for cough. 03/27/18   Midge Minium, MD  meloxicam (MOBIC) 15 MG tablet Take 15 mg by mouth daily as needed for pain.    [provider]  omeprazole (PRILOSEC) 20 MG capsule Take 20 mg by mouth daily as needed (heartburn).    [provider]  rosuvastatin (CRESTOR) 20 MG tablet TAKE 1 TABLET BY MOUTH ONCE A DAY FOR CHOLESTEROL 11/14/17   Midge Minium, MD  sertraline (ZOLOFT) 100 MG tablet Take 2 tablets (200 mg total)  by mouth daily. 05/11/18   Midge Minium, MD    Family History Family History  Problem Relation Age of Onset  . Hyperlipidemia Mother   . Cancer Mother        lung cancer  . Hyperlipidemia Father   . Diabetes Father   . COPD Father   . Sudden death Neg Hx   . Heart attack Neg Hx   . Hypertension Neg Hx     Social History Social History   Tobacco Use  . Smoking status: Never Smoker  . Smokeless tobacco: Never Used  Substance Use Topics  . Alcohol use: Yes    Comment: Weekends; 2-3 beverages; Social.  . Drug use: No     Allergies   No known allergies   Review of Systems Review of Systems   Physical Exam Triage Vital Signs ED Triage Vitals  Enc Vitals Group     BP 08/12/18 1042 (!) 126/94     Pulse Rate 08/12/18 1042 83     Resp 08/12/18 1042 16     Temp 08/12/18 1042 98.3 F (36.8 C)     Temp Source 08/12/18 1042 Oral     SpO2 08/12/18 1042 99 %     Weight --      Height --      Head Circumference --      Peak Flow --      Pain Score 08/12/18 1044 7     Pain Loc --      Pain Edu? --      Excl. in [place]? --    No data found.  Updated Vital Signs BP (!) 126/94 (BP Location: Right Arm)   Pulse 83   Temp 98.3 F (36.8 C) (Oral)   Resp 16   SpO2 99%   Visual Acuity Right Eye Distance:   Left Eye Distance:   Bilateral Distance:    Right Eye Near:   Left Eye Near:    Bilateral Near:     Physical Exam Constitutional:      General: She is not in acute distress.    Appearance: She is well-developed.  Cardiovascular:     Rate and Rhythm: Normal rate and regular rhythm.     Heart sounds: Normal heart sounds.  Pulmonary:     Effort: Pulmonary effort is normal.     Breath sounds: Normal breath sounds.  Musculoskeletal:     Left ankle: She exhibits swelling. She exhibits normal range of motion, no ecchymosis, no deformity, no laceration and normal pulse. Tenderness. AITFL tenderness found. Achilles tendon normal.     Left foot: Normal.      Comments: Soft tissue tenderness to soft tissue surrounding lateral malleolus without specific bony tenderness; full ROM to ankle;  strong pedal pulse; cap refill < 2 seconds to toes; gross sensation intact; edema noted primarily to lateral ankle but also mildly to medial ankle   Skin:    General: Skin is warm and dry.  Neurological:     Mental Status: She is alert and oriented to person, place, and time.      UC Treatments / Results  Labs (all labs ordered are listed, but only abnormal results are displayed) Labs Reviewed - No data to display  EKG None  Radiology Dg Ankle Complete Left  Result Date: 08/12/2018 CLINICAL DATA:  Pt stepped on a stepping stone and twisted left ankle and fell yesterday. Hurts to walk and pain around the lateral malleolus. Swollen. No previous fx or injury. History of osteoporosis.twist, swelling EXAM: LEFT ANKLE COMPLETE - 3+ VIEW COMPARISON:  Radiograph 06/11/2013 FINDINGS: Soft tissue swelling inferior to the medial and lateral malleolus period Ankle mortise intact. The talar dome is normal. No malleolar fracture. The calcaneus is normal. IMPRESSION: 1.  No fracture or dislocation. 2. Soft tissue swelling about the ankle Electronically Signed   By: Suzy Bouchard M.D.   On: 08/12/2018 11:59    Procedures Procedures (including critical care time)  Medications Ordered in UC Medications - No data to display  Initial Impression / Assessment and Plan / UC Course  I have reviewed the triage vital signs and the nursing notes.  Pertinent labs & imaging results that were available during my care of the patient were reviewed by me and considered in my medical decision making (see chart for details).     Xray negative for fracture. Consistent with sprain. Brace provided. Encouraged ice and elevation, nsaids for pain control. Return precautions provided. Patient verbalized understanding and agreeable to plan.   Final Clinical Impressions(s) / UC Diagnoses    Final diagnoses:  Sprain of anterior talofibular ligament of left ankle, initial encounter     Discharge Instructions     Negative for any fractures today.  Consistent with ankle sprain.  Ice, elevation, antiinflammatories as needed for pain.  Activity as tolerated.  See exercises provided.  Follow up with your orthopedist and/or PCP as needed for persistent symptoms.     ED Prescriptions    None     Controlled Substance Prescriptions [place] Controlled Substance Registry consulted? Not Applicable   Zigmund Gottron, NP 08/12/18 1400

## 2018-08-25 ENCOUNTER — Other Ambulatory Visit: Payer: Self-pay | Admitting: Family Medicine

## 2018-11-22 ENCOUNTER — Other Ambulatory Visit: Payer: Self-pay | Admitting: Family Medicine

## 2019-02-18 ENCOUNTER — Other Ambulatory Visit: Payer: Self-pay | Admitting: Family Medicine

## 2019-03-02 DIAGNOSIS — U071 COVID-19: Secondary | ICD-10-CM

## 2019-03-02 HISTORY — DX: COVID-19: U07.1

## 2019-03-13 ENCOUNTER — Encounter: Payer: Self-pay | Admitting: Family Medicine

## 2019-05-24 ENCOUNTER — Other Ambulatory Visit: Payer: Self-pay

## 2019-05-24 ENCOUNTER — Encounter: Payer: Self-pay | Admitting: Family Medicine

## 2019-05-24 ENCOUNTER — Other Ambulatory Visit: Payer: Self-pay | Admitting: Family Medicine

## 2019-05-24 ENCOUNTER — Telehealth (INDEPENDENT_AMBULATORY_CARE_PROVIDER_SITE_OTHER): Payer: Managed Care, Other (non HMO) | Admitting: Family Medicine

## 2019-05-24 DIAGNOSIS — F411 Generalized anxiety disorder: Secondary | ICD-10-CM | POA: Diagnosis not present

## 2019-05-24 DIAGNOSIS — E785 Hyperlipidemia, unspecified: Secondary | ICD-10-CM | POA: Diagnosis not present

## 2019-05-24 DIAGNOSIS — M810 Age-related osteoporosis without current pathological fracture: Secondary | ICD-10-CM

## 2019-05-24 DIAGNOSIS — Z1231 Encounter for screening mammogram for malignant neoplasm of breast: Secondary | ICD-10-CM

## 2019-05-24 MED ORDER — SERTRALINE HCL 100 MG PO TABS: 200.0000 mg | ORAL_TABLET | Freq: Every day | ORAL | 1 refills | Status: DC

## 2019-05-24 MED ORDER — ROSUVASTATIN CALCIUM 20 MG PO TABS: ORAL_TABLET | ORAL | 1 refills | Status: DC

## 2019-05-24 NOTE — Progress Notes (Signed)
I have discussed the procedure for the virtual visit with the patient who has given consent to proceed with assessment and treatment.   Pt unable to obtain vitals.   Jessica L Brodmerkel, CMA     

## 2019-05-24 NOTE — Progress Notes (Signed)
Virtual Visit via Video   I connected with patient on 05/24/19 at  1:00 PM EDT by a video enabled telemedicine application and verified that I am speaking with the correct person using two identifiers.  Location patient: Home Location provider: Acup[facility]urist, Office Persons participating in the virtual visit: Patient, Provider, [place] (Jess B)  I discussed the limitations of evaluation and management by telemedicine and the availability of in person appointments. The patient expressed understanding and agreed to proceed.  Subjective:   HPI:  Hyperlipidemia- chronic problem, on Crestor 20mg  daily.  No regular exercise.  No CP, SOB, abd pain, N/V, edema.  Osteoporosis- chronic problem, on Fosamax 70mg  weekly.  Last DEXA 2018- due for repeat.  Anxiety- ongoing issue, on Sertraline 200mg  daily.  Pt reports Sertraline is working well, 'i've been on it for years'.  Feels she has found balance w/ [facility].    ROS:   See pertinent positives and negatives per HPI.  Patient Active Problem List   Diagnosis Date Noted  . Obesity (BMI 30.0-34.9) 11/30/2017  . Osteoporosis 03/15/2017  . Distal radius fracture, left 05/03/2016  . Rib pain on right side 02/20/2015  . Physical exam 11/08/2014  . Allergic rhinitis 10/02/2014  . Chest pressure 10/02/2014  . Hyperlipidemia 05/08/2014  . Anxiety state 05/08/2014  . Abscess of right breast 05/08/2014  . Abnormal cervical Papanicolaou smear 05/08/2014  . Rectal bleeding 05/08/2014  . Skin cancer screening 05/08/2014  . Right knee pain 09/14/2013  . Stress fracture of calcaneus 01/17/2013  . 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 Bunion of great toe 01/17/2013  . Left foot pain 12/08/2012  . Osteoarthritis of hand 04/20/2011    Social History   Tobacco Use  . Smoking status: Never Smoker  . Smokeless tobacco: Never Used  Substance Use Topics  . Alcohol use: Yes    Comment: Weekends; 2-3 beverages; Social.    Current Outpatient  Medications:  .  acetaminophen (TYLENOL) 500 MG tablet, Take 1,000 mg by mouth every 6 (six) hours as needed for moderate pain., Disp: , Rfl:  .  alendronate (FOSAMAX) 70 MG tablet, TAKE 1 TABLET BY MOUTH EVERY 7 DAYS. TAKE WITH A FULL GLASS OF WATER ON AN EMPTY STOMACH., Disp: 12 tablet, Rfl: 3 .  Calcium Citrate-Vitamin D (CALCIUM + D PO), Take 1 tablet by mouth 2 (two) times daily., Disp: , Rfl:  .  cetirizine (ZYRTEC) 10 MG tablet, Take 1 tablet (10 mg total) by mouth daily. (Patient taking differently: Take 10 mg by mouth daily as needed for allergies. ), Disp: 30 tablet, Rfl: 11 .  cholecalciferol (VITAMIN D) 1000 UNITS tablet, Take 1,000 Units by mouth daily., Disp: , Rfl:  .  meloxicam (MOBIC) 15 MG tablet, Take 15 mg by mouth daily as needed for pain., Disp: , Rfl:  .  omeprazole (PRILOSEC) 20 MG capsule, Take 20 mg by mouth daily as needed (heartburn)., Disp: , Rfl:  .  rosuvastatin (CRESTOR) 20 MG tablet, TAKE 1 TABLET BY MOUTH ONCE A DAY FOR CHOLESTEROL, Disp: 90 tablet, Rfl: 2 .  sertraline (ZOLOFT) 100 MG tablet, TAKE 2 TABLETS BY MOUTH EVERY DAY, Disp: 180 tablet, Rfl: 1 .  albuterol (PROVENTIL HFA;VENTOLIN HFA) 108 (90 Base) MCG/ACT inhaler, Inhale 2 puffs into the lungs every 6 (six) hours as needed for wheezing or shortness of breath. (Patient not taking: Reported on 05/24/2019), Disp: 1 Inhaler, Rfl: 2  Allergies  Allergen Reactions  . No Known Allergies  Objective:   There were no vitals taken for this visit.  AAOx3, NAD NCAT, EOMI No obvious CN deficits Coloring WNL Pt is able to speak clearly, coherently without shortness of breath or increased work of breathing.  Thought process is linear.  Mood is appropriate.   Assessment and Plan:   Hyperlipidemia- chronic problem.  Tolerating statin w/o difficulty.  Encouraged healthy diet and regular exercise.  Check labs.  Adjust meds prn   Osteoporosis- ongoing issue for pt.  Due for repeat DEXA.  Order this and Vit  D.  Anxiety- ongoing issue.  Currently well controlled on Sertraline.  No med changes at this time.   Annye Asa, MD 05/24/2019

## 2019-05-28 ENCOUNTER — Telehealth: Payer: Self-pay | Admitting: Family Medicine

## 2019-05-28 NOTE — Telephone Encounter (Signed)
LM to call back to schedule labs.

## 2019-06-08 ENCOUNTER — Ambulatory Visit
Admission: RE | Admit: 2019-06-08 | Discharge: 2019-06-08 | Disposition: A | Discharge status: Home / Self Care | Payer: Managed Care, Other (non HMO) | Source: Ambulatory Visit

## 2019-06-08 ENCOUNTER — Other Ambulatory Visit: Payer: Self-pay

## 2019-06-08 DIAGNOSIS — Z1231 Encounter for screening mammogram for malignant neoplasm of breast: Secondary | ICD-10-CM

## 2019-06-13 ENCOUNTER — Ambulatory Visit (INDEPENDENT_AMBULATORY_CARE_PROVIDER_SITE_OTHER): Payer: Managed Care, Other (non HMO)

## 2019-06-13 ENCOUNTER — Other Ambulatory Visit: Payer: Self-pay

## 2019-06-13 DIAGNOSIS — E785 Hyperlipidemia, unspecified: Secondary | ICD-10-CM

## 2019-06-13 DIAGNOSIS — M810 Age-related osteoporosis without current pathological fracture: Secondary | ICD-10-CM | POA: Diagnosis not present

## 2019-06-13 LAB — LIPID PANEL
Cholesterol: 242 mg/dL — ABNORMAL HIGH (ref 0–200)
HDL: 110.4 mg/dL (ref >39.00)
LDL Cholesterol: 110 mg/dL — ABNORMAL HIGH (ref 0–99)
NonHDL: 131.93
Total CHOL/HDL Ratio: 2
Triglycerides: 112 mg/dL (ref 0.0–149.0)
VLDL: 22.4 mg/dL (ref 0.0–40.0)

## 2019-06-13 LAB — BASIC METABOLIC PANEL
BUN: 20 mg/dL (ref 6–23)
CO2: 27 mEq/L (ref 19–32)
Calcium: 8.9 mg/dL (ref 8.4–10.5)
Chloride: 103 mEq/L (ref 96–112)
Creatinine, Ser: 0.8 mg/dL (ref 0.40–1.20)
GFR: 73.83 mL/min (ref >60.00)
Glucose, Bld: 94 mg/dL (ref 70–99)
Potassium: 4.3 mEq/L (ref 3.5–5.1)
Sodium: 139 mEq/L (ref 135–145)

## 2019-06-13 LAB — CBC WITH DIFFERENTIAL/PLATELET
Basophils Absolute: 0 10*3/uL (ref 0.0–0.1)
Basophils Relative: 0.3 % (ref 0.0–3.0)
Eosinophils Absolute: 0.1 10*3/uL (ref 0.0–0.7)
Eosinophils Relative: 2.8 % (ref 0.0–5.0)
HCT: 30 % — ABNORMAL LOW (ref 36.0–46.0)
Hemoglobin: 9.8 g/dL — ABNORMAL LOW (ref 12.0–15.0)
Lymphocytes Relative: 49.9 % — ABNORMAL HIGH (ref 12.0–46.0)
Lymphs Abs: 1.8 10*3/uL (ref 0.7–4.0)
MCHC: 32.7 g/dL (ref 30.0–36.0)
MCV: 75.7 fl — ABNORMAL LOW (ref 78.0–100.0)
Monocytes Absolute: 0.2 10*3/uL (ref 0.1–1.0)
Monocytes Relative: 6.3 % (ref 3.0–12.0)
Neutro Abs: 1.5 10*3/uL (ref 1.4–7.7)
Neutrophils Relative %: 40.7 % — ABNORMAL LOW (ref 43.0–77.0)
Platelets: 186 10*3/uL (ref 150.0–400.0)
RBC: 3.97 Mil/uL (ref 3.87–5.11)
RDW: 14.9 % (ref 11.5–15.5)
WBC: 3.6 10*3/uL — ABNORMAL LOW (ref 4.0–10.5)

## 2019-06-13 LAB — HEPATIC FUNCTION PANEL
ALT: 9 U/L (ref 0–35)
AST: 21 U/L (ref 0–37)
Albumin: 4.3 g/dL (ref 3.5–5.2)
Alkaline Phosphatase: 58 U/L (ref 39–117)
Bilirubin, Direct: 0.1 mg/dL (ref 0.0–0.3)
Total Bilirubin: 0.4 mg/dL (ref 0.2–1.2)
Total Protein: 6.9 g/dL (ref 6.0–8.3)

## 2019-06-13 LAB — TSH: TSH: 2.18 u[IU]/mL (ref 0.35–4.50)

## 2019-06-13 LAB — VITAMIN D 25 HYDROXY (VIT D DEFICIENCY, FRACTURES): VITD: 34.05 ng/mL (ref 30.00–100.00)

## 2019-06-14 ENCOUNTER — Encounter: Payer: Self-pay | Admitting: General Practice

## 2019-06-14 ENCOUNTER — Other Ambulatory Visit: Payer: Self-pay | Admitting: Family Medicine

## 2019-06-14 DIAGNOSIS — D649 Anemia, unspecified: Secondary | ICD-10-CM

## 2019-06-18 ENCOUNTER — Other Ambulatory Visit: Payer: Self-pay

## 2019-06-18 ENCOUNTER — Ambulatory Visit (INDEPENDENT_AMBULATORY_CARE_PROVIDER_SITE_OTHER): Payer: Managed Care, Other (non HMO) | Admitting: Family Medicine

## 2019-06-18 ENCOUNTER — Encounter: Payer: Self-pay | Admitting: Family Medicine

## 2019-06-18 VITALS — BP 121/82 | HR 63 | Temp 97.9°F | Resp 16 | Ht 61.0 in | Wt 166.4 lb

## 2019-06-18 DIAGNOSIS — E785 Hyperlipidemia, unspecified: Secondary | ICD-10-CM | POA: Diagnosis not present

## 2019-06-18 DIAGNOSIS — Z0000 Encounter for general adult medical examination without abnormal findings: Secondary | ICD-10-CM | POA: Diagnosis not present

## 2019-06-18 DIAGNOSIS — M810 Age-related osteoporosis without current pathological fracture: Secondary | ICD-10-CM | POA: Diagnosis not present

## 2019-06-18 NOTE — Assessment & Plan Note (Signed)
Chronic problem.  Tolerating statin w/o difficulty.  Check labs.  Adjust meds prn  

## 2019-06-18 NOTE — Patient Instructions (Addendum)
Follow up in 6 months to recheck cholesterol Once we have your stool test we will decide on next steps Try and schedule exercise the way you would schedule a meeting or appt Continue to work on healthy diet- you can do it! Call with any questions or concerns Hang in there! Happy Spring!

## 2019-06-18 NOTE — Progress Notes (Signed)
   Subjective:    Patient ID: Rachel Fitzpatrick, female    DOB: [birth year], 59 y.o.   MRN: [id number]  HPI CPE- UTD on pap, mammo, colonoscopy, immunizations.  Pt had J&J COVID vaccine   Review of Systems Patient reports no vision/ hearing changes, adenopathy,fever, weight change,  persistant/recurrent hoarseness , swallowing issues, chest pain, palpitations, edema, persistant/recurrent cough, hemoptysis, dyspnea (rest/exertional/paroxysmal nocturnal), gastrointestinal bleeding (melena, rectal bleeding), abdominal pain, significant heartburn, bowel changes, GU symptoms (dysuria, hematuria, incontinence), Gyn symptoms (abnormal  bleeding, pain),  syncope, focal weakness, memory loss, numbness & tingling, skin/hair/nail changes, abnormal bruising or bleeding, anxiety, or depression.   This visit occurred during the SARS-CoV-2 public health emergency.  Safety protocols were in place, including screening questions prior to the visit, additional usage of staff PPE, and extensive cleaning of exam room while observing appropriate contact time as indicated for disinfecting solutions.       Objective:   Physical Exam General Appearance:    Alert, cooperative, no distress, appears stated age  Head:    Normocephalic, without obvious abnormality, atraumatic  Eyes:    PERRL, conjunctiva/corneas clear, EOM's intact, fundi    benign, both eyes  Ears:    Normal TM's and external ear canals, both ears  Nose:   Deferred due to COVID  Throat:   Neck:   Supple, symmetrical, trachea midline, no adenopathy;    Thyroid: no enlargement/tenderness/nodules  Back:     Symmetric, no curvature, ROM normal, no CVA tenderness  Lungs:     Clear to auscultation bilaterally, respirations unlabored  Chest Wall:    No tenderness or deformity   Heart:    Regular rate and rhythm, S1 and S2 normal, no murmur, rub   or gallop  Breast Exam:    Deferred to GYN  Abdomen:     Soft, non-tender, bowel sounds active all four  quadrants,    no masses, no organomegaly  Genitalia:    Deferred to GYN  Rectal:    Extremities:   Extremities normal, atraumatic, no cyanosis or edema  Pulses:   2+ and symmetric all extremities  Skin:   Skin color, texture, turgor normal, no rashes or lesions  Lymph nodes:   Cervical, supraclavicular, and axillary nodes normal  Neurologic:   CNII-XII intact, normal strength, sensation and reflexes    throughout          Assessment & Plan:

## 2019-06-18 NOTE — Assessment & Plan Note (Signed)
Pt's PE WNL w/ exception of obesity.  UTD on pap, mammo, colonoscopy, immunizations.  Reviewed recent labs- iFOB pending.  Anticipatory guidance provided.

## 2019-06-18 NOTE — Assessment & Plan Note (Signed)
Check Vit D and replete prn. 

## 2019-06-21 ENCOUNTER — Other Ambulatory Visit: Payer: Self-pay

## 2019-06-21 ENCOUNTER — Telehealth: Payer: Self-pay

## 2019-06-21 ENCOUNTER — Other Ambulatory Visit: Payer: Self-pay | Admitting: General Practice

## 2019-06-21 ENCOUNTER — Other Ambulatory Visit (INDEPENDENT_AMBULATORY_CARE_PROVIDER_SITE_OTHER): Payer: Managed Care, Other (non HMO)

## 2019-06-21 DIAGNOSIS — D649 Anemia, unspecified: Secondary | ICD-10-CM

## 2019-06-21 DIAGNOSIS — Z0000 Encounter for general adult medical examination without abnormal findings: Secondary | ICD-10-CM

## 2019-06-21 LAB — FECAL OCCULT BLOOD, IMMUNOCHEMICAL: Fecal Occult Bld: POSITIVE — AB

## 2019-06-21 MED ORDER — FERROUS SULFATE 325 (65 FE) MG PO TABS: 325.0000 mg | ORAL_TABLET | Freq: Every day | ORAL | 6 refills | Status: DC

## 2019-06-21 NOTE — Telephone Encounter (Signed)
Received a call from [facility] lab. Patient has a positive IFOB

## 2019-06-21 NOTE — Addendum Note (Signed)
Addended by: Fritz Pickerel on: 06/21/2019 03:41 PM   Modules accepted: Orders

## 2019-06-21 NOTE — Telephone Encounter (Signed)
See result note.  

## 2019-06-22 ENCOUNTER — Encounter: Payer: Self-pay | Admitting: Internal Medicine

## 2019-07-13 ENCOUNTER — Encounter: Payer: Self-pay | Admitting: Internal Medicine

## 2019-07-13 ENCOUNTER — Ambulatory Visit (INDEPENDENT_AMBULATORY_CARE_PROVIDER_SITE_OTHER): Payer: Managed Care, Other (non HMO) | Admitting: Internal Medicine

## 2019-07-13 ENCOUNTER — Other Ambulatory Visit (INDEPENDENT_AMBULATORY_CARE_PROVIDER_SITE_OTHER): Payer: Managed Care, Other (non HMO)

## 2019-07-13 VITALS — BP 108/64 | HR 85 | Temp 97.9°F | Ht 61.0 in | Wt 170.2 lb

## 2019-07-13 DIAGNOSIS — R195 Other fecal abnormalities: Secondary | ICD-10-CM

## 2019-07-13 DIAGNOSIS — D509 Iron deficiency anemia, unspecified: Secondary | ICD-10-CM

## 2019-07-13 DIAGNOSIS — R12 Heartburn: Secondary | ICD-10-CM

## 2019-07-13 LAB — CBC WITH DIFFERENTIAL/PLATELET
Basophils Absolute: 0 10*3/uL (ref 0.0–0.1)
Basophils Relative: 0.4 % (ref 0.0–3.0)
Eosinophils Absolute: 0.1 10*3/uL (ref 0.0–0.7)
Eosinophils Relative: 1.6 % (ref 0.0–5.0)
HCT: 33 % — ABNORMAL LOW (ref 36.0–46.0)
Hemoglobin: 10.7 g/dL — ABNORMAL LOW (ref 12.0–15.0)
Lymphocytes Relative: 39.1 % (ref 12.0–46.0)
Lymphs Abs: 2.1 10*3/uL (ref 0.7–4.0)
MCHC: 32.4 g/dL (ref 30.0–36.0)
MCV: 81.1 fl (ref 78.0–100.0)
Monocytes Absolute: 0.4 10*3/uL (ref 0.1–1.0)
Monocytes Relative: 6.8 % (ref 3.0–12.0)
Neutro Abs: 2.8 10*3/uL (ref 1.4–7.7)
Neutrophils Relative %: 52.1 % (ref 43.0–77.0)
Platelets: 204 10*3/uL (ref 150.0–400.0)
RBC: 4.07 Mil/uL (ref 3.87–5.11)
RDW: 24.4 % — ABNORMAL HIGH (ref 11.5–15.5)
WBC: 5.3 10*3/uL (ref 4.0–10.5)

## 2019-07-13 MED ORDER — CLENPIQ 10-3.5-12 MG-GM -GM/160ML PO SOLN: 1.0000 | ORAL | 0 refills | Status: DC

## 2019-07-13 NOTE — Patient Instructions (Signed)
Your provider has requested that you go to the basement level for lab work before leaving today. Press "B" on the elevator. The lab is located at the first door on the left as you exit the elevator.   You have been scheduled for an endoscopy and colonoscopy. Please follow the written instructions given to you at your visit today. Please pick up your prep supplies at the pharmacy within the next 1-3 days. If you use inhalers (even only as needed), please bring them with you on the day of your procedure.   HOLD YOUR IRON FOR 3 DAYS PRIOR TO YOUR PROCEDURES.    I appreciate the opportunity to care for you. Silvano Rusk, MD, [facility]

## 2019-07-13 NOTE — Progress Notes (Signed)
Rachel Fitzpatrick 59 y.o. 1961-02-14 [id number]  Assessment & Plan:   Encounter Diagnoses  Name Primary?  . Iron deficiency anemia, unspecified iron deficiency anemia type Yes  . Heme + stool   . Heartburn    It is possible but unlikely that there is a colonic neoplastic lesion causing this iron deficiency anemia. We need to look for that however. Colonoscopy will be undertaken and if that is negative an EGD will follow. We will proceed with colonoscopy prior to EGD on the day of procedures because I FOBT was positive and that is indicative of human hemoglobin from the colon and not above.  CBC is rechecked today and show she is improving on iron which she will continue. If EGD is undertaken strongly consider celiac biopsies  Lab Results  Component Value Date   WBC 5.3 07/13/2019   HGB 10.7 (L) 07/13/2019   HCT 33.0 (L) 07/13/2019   MCV 81.1 07/13/2019   PLT 204.0 07/13/2019   I appreciate the opportunity to care for this patient. CC: Midge Minium, MD   Subjective:   Chief Complaint: New iron deficiency anemia heme positive stool  HPI This is a 59 year old white woman with a new iron deficiency anemia and I FOBT positive stool, status post remote transanal resection of a rectal polyp in the early 2000 range. Subsequent follow-up colonoscopies most recently by me in 2017 demonstrated negative exam with a polypectomy scar in the rectum as in the past. She had some rectal bleeding in 2017 at which point I repeated her colonoscopy. The only other abnormality there in addition to that scar was some sigmoid diverticulosis.  She does not have visible bleeding now. She had an I FOBT test on 06/21/19 which was positive. CBC on 06/13/2019 showed hemoglobin 9.8 MCV 75 normal platelets white count 3.6. Prior to that the most recent CBC I have shows hemoglobin 12.6 MCV 90 on 06/06/2017.  She has a varied diet does eat dark green leafy vegetables and iron-containing foods. She has gained  some weight during the pandemic and has been using some omeprazole as needed for increased heartburn. She does not donate blood. Allergies  Allergen Reactions  . No Known Allergies    Current Meds  Medication Sig  . acetaminophen (TYLENOL) 500 MG tablet Take 1,000 mg by mouth every 6 (six) hours as needed for moderate pain.  Marland Kitchen alendronate (FOSAMAX) 70 MG tablet TAKE 1 TABLET BY MOUTH EVERY 7 DAYS. TAKE WITH A FULL GLASS OF WATER ON AN EMPTY STOMACH.  . Calcium Citrate-Vitamin D (CALCIUM + D PO) Take 1 tablet by mouth 2 (two) times daily.  . cetirizine (ZYRTEC) 10 MG tablet Take 1 tablet (10 mg total) by mouth daily. (Patient taking differently: Take 10 mg by mouth daily as needed for allergies. )  . cholecalciferol (VITAMIN D) 1000 UNITS tablet Take 1,000 Units by mouth daily.  . ferrous sulfate 325 (65 FE) MG tablet Take 1 tablet (325 mg total) by mouth daily with breakfast.  . omeprazole (PRILOSEC) 20 MG capsule Take 20 mg by mouth daily as needed (heartburn).  . rosuvastatin (CRESTOR) 20 MG tablet TAKE 1 TABLET BY MOUTH ONCE A DAY FOR CHOLESTEROL  . sertraline (ZOLOFT) 100 MG tablet Take 2 tablets (200 mg total) by mouth daily.   Past Medical History:  Diagnosis Date  . Adenomatous colon polyp   . Allergy   . Anxiety   . Arthritis   . Blood in stool   . Complication  of anesthesia    Reports hypotension with anesthesia  . COVID-19 03/2019  . GERD (gastroesophageal reflux disease)   . Hypercholesteremia   . Osteopenia    Past Surgical History:  Procedure Laterality Date  . APPENDECTOMY    . COLONOSCOPY    . EYE SURGERY    . FOOT SURGERY    . HAND SURGERY     tendon repair  . ORIF WRIST FRACTURE Left 05/03/2016   Procedure: OPEN REDUCTION INTERNAL FIXATION (ORIF) WRIST FRACTURE/LEFT;  Surgeon: Iran Planas, MD;  Location: [place];  Service: Orthopedics;  Laterality: Left;  . 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   Review of Systems As per HPI otherwise negative  Objective:   Physical Exam BP 108/64   Pulse 85   Temp 97.9 F (36.6 C)   Ht 5\' 1"  (1.549 m)   Wt 170 lb 3.2 oz (77.2 kg)   SpO2 98%   BMI 32.16 kg/m  Well-developed well-nourished white woman overweight to obese no acute distress Lungs clear Heart sounds normal Abdomen soft nontender without organomegaly or mass Appropriate mood and affect Alert and oriented x3

## 2019-07-17 ENCOUNTER — Encounter: Payer: Self-pay | Admitting: Internal Medicine

## 2019-07-31 ENCOUNTER — Encounter: Payer: Self-pay | Admitting: Internal Medicine

## 2019-07-31 ENCOUNTER — Ambulatory Visit (AMBULATORY_SURGERY_CENTER): Payer: Managed Care, Other (non HMO) | Admitting: Internal Medicine

## 2019-07-31 ENCOUNTER — Encounter: Payer: Self-pay | Admitting: Family Medicine

## 2019-07-31 ENCOUNTER — Other Ambulatory Visit: Payer: Self-pay

## 2019-07-31 VITALS — BP 120/80 | HR 70 | Temp 97.3°F | Resp 18 | Ht 61.0 in | Wt 170.0 lb

## 2019-07-31 DIAGNOSIS — R195 Other fecal abnormalities: Secondary | ICD-10-CM | POA: Diagnosis not present

## 2019-07-31 DIAGNOSIS — D509 Iron deficiency anemia, unspecified: Secondary | ICD-10-CM

## 2019-07-31 DIAGNOSIS — K5730 Diverticulosis of large intestine without perforation or abscess without bleeding: Secondary | ICD-10-CM

## 2019-07-31 DIAGNOSIS — K449 Diaphragmatic hernia without obstruction or gangrene: Secondary | ICD-10-CM | POA: Diagnosis not present

## 2019-07-31 MED ORDER — SODIUM CHLORIDE 0.9 % IV SOLN
500.0000 mL | Freq: Once | INTRAVENOUS | Status: DC
Start: 2019-07-31 — End: 2019-07-31

## 2019-07-31 NOTE — Progress Notes (Signed)
Called to room to assist during endoscopic procedure.  Patient ID and intended procedure confirmed with present staff. Received instructions for my participation in the procedure from the performing physician.  

## 2019-07-31 NOTE — Telephone Encounter (Signed)
Reviewed and PCP absence.  Seems consistent with a typical tick bite with mild local inflammation.  Keep skin clean and dry.  Watch out for any increased redness, warmth or tenderness as these would indicate potential infection.  If these occur she would need assessment.  Please try to assess with the patient how long she thinks the tick was attached.  If less than a few days then the likelihood of a tickborne illness is very low.  If attached for longer she needs to keep an eye out for any fever, chills rash of hands and feet, bull's-eye rash, headache, nausea or vomiting.  If any symptoms develop she needs to be seen immediately.

## 2019-07-31 NOTE — Progress Notes (Signed)
No problems noted in the recovery room. maw 

## 2019-07-31 NOTE — Progress Notes (Signed)
Report given to PACU, vss 

## 2019-07-31 NOTE — Op Note (Signed)
[facility] Patient Name: Rachel Fitzpatrick Procedure Date: 07/31/2019 3:15 PM MRN: [id number] Endoscopist: Gatha Mayer , MD Age: 59 Referring MD:  Date of Birth: 07-Aug-1960 Gender: Female Account #: [id number] Procedure:                Upper GI endoscopy Indications:              Iron deficiency anemia Medicines:                Propofol per Anesthesia, Monitored Anesthesia Care Procedure:                Pre-Anesthesia Assessment:                           - Prior to the procedure, a History and Physical                            was performed, and patient medications and                            allergies were reviewed. The patient's tolerance of                            previous anesthesia was also reviewed. The risks                            and benefits of the procedure and the sedation                            options and risks were discussed with the patient.                            All questions were answered, and informed consent                            was obtained. Prior Anticoagulants: The patient has                            taken no previous anticoagulant or antiplatelet                            agents. ASA Grade Assessment: II - A patient with                            mild systemic disease. After reviewing the risks                            and benefits, the patient was deemed in                            satisfactory condition to undergo the procedure.                           After obtaining informed consent, the endoscope was  passed under direct vision. Throughout the                            procedure, the patient's blood pressure, pulse, and                            oxygen saturations were monitored continuously. The                            Endoscope was introduced through the mouth, and                            advanced to the second part of duodenum. The upper                            GI  endoscopy was accomplished without difficulty.                            The patient tolerated the procedure well. Scope In: Scope Out: Findings:                 An 8 cm hiatal hernia was present.                           The exam was otherwise without abnormality.                           The cardia and gastric fundus were otherwise normal                            on retroflexion.                           Biopsies for histology were taken with a cold                            forceps in the entire duodenum for evaluation of                            celiac disease. Verification of patient                            identification for the specimen was done. Estimated                            blood loss was minimal. Complications:            No immediate complications. Estimated Blood Loss:     Estimated blood loss was minimal. Impression:               - 8 cm hiatal hernia. No Lysbeth Galas lesions seen but ?                            if could have these intermittently as a cause of Fe  defic anemia                           - The examination was otherwise normal.                           - Biopsies were taken with a cold forceps for                            evaluation of celiac disease. Recommendation:           - Patient has a contact number available for                            emergencies. The signs and symptoms of potential                            delayed complications were discussed with the                            patient. Return to normal activities tomorrow.                            Written discharge instructions were provided to the                            patient.                           - Resume previous diet.                           - Continue present medications. Gatha Mayer, MD 07/31/2019 4:04:07 PM This report has been signed electronically.

## 2019-07-31 NOTE — Patient Instructions (Addendum)
There was a hiatal hernia - stomach moves out of position from abdominal cavity to chest cavity. This can lead to loss of blood due to pinching of the stomach by diaphragm muscles - did not see signs of that but it may not always show as it can come and go (the little ulcers caused by this).  I took biopsies of the small intestine to look for gluten allergy or celiac disease that could lead to poor absorption of iron.  Colonoscopy was without any lesions related top anemia. The blood in the stool probably was a false + or from transient bleeding from internal hemorrhoids - but those would not bleed enough to lower iron (or if they did you would see blood)  I appreciate the opportunity to care for you. Gatha Mayer, MD, FACG    YOU HAD AN ENDOSCOPIC PROCEDURE TODAY AT [facility]:   Refer to the procedure report that was given to you for any specific questions about what was found during the examination.  If the procedure report does not answer your questions, please call your gastroenterologist to clarify.  If you requested that your care partner not be given the details of your procedure findings, then the procedure report has been included in a sealed envelope for you to review at your convenience later.  YOU SHOULD EXPECT: Some feelings of bloating in the abdomen. Passage of more gas than usual.  Walking can help get rid of the air that was put into your GI tract during the procedure and reduce the bloating. If you had a lower endoscopy (such as a colonoscopy or flexible sigmoidoscopy) you may notice spotting of blood in your stool or on the toilet paper. If you underwent a bowel prep for your procedure, you may not have a normal bowel movement for a few days.  Please Note:  You might notice some irritation and congestion in your nose or some drainage.  This is from the oxygen used during your procedure.  There is no need for concern and it should clear up in a day or  so.  SYMPTOMS TO REPORT IMMEDIATELY:   Following lower endoscopy (colonoscopy or flexible sigmoidoscopy):  Excessive amounts of blood in the stool  Significant tenderness or worsening of abdominal pains  Swelling of the abdomen that is new, acute  Fever of 100F or higher   Following upper endoscopy (EGD)  Vomiting of blood or coffee ground material  New chest pain or pain under the shoulder blades  Painful or persistently difficult swallowing  New shortness of breath  Fever of 100F or higher  Black, tarry-looking stools  For urgent or emergent issues, a gastroenterologist can be reached at any hour by calling [phone]. Do not use MyChart messaging for urgent concerns.    DIET:  We do recommend a small meal at first, but then you may proceed to your regular diet.  Drink plenty of fluids but you should avoid alcoholic beverages for 24 hours.  ACTIVITY:  You should plan to take it easy for the rest of today and you should NOT DRIVE or use heavy machinery until tomorrow (because of the sedation medicines used during the test).    FOLLOW UP: Our staff will call the number listed on your records 48-72 hours following your procedure to check on you and address any questions or concerns that you may have regarding the information given to you following your procedure. If we do not reach you, we will  leave a message.  We will attempt to reach you two times.  During this call, we will ask if you have developed any symptoms of COVID 19. If you develop any symptoms (ie: fever, flu-like symptoms, shortness of breath, cough etc.) before then, please call [phone].  If you test positive for Covid 19 in the 2 weeks post procedure, please call and report this information to [place].    If any biopsies were taken you will be contacted by phone or by letter within the next 1-3 weeks.  Please call us at [phone] if you have not heard about the biopsies in 3 weeks.     SIGNATURES/CONFIDENTIALITY: You and/or your care partner have signed paperwork which will be entered into your electronic medical record.  These signatures attest to the fact that that the information above on your After Visit Summary has been reviewed and is understood.  Full responsibility of the confidentiality of this discharge information lies with you and/or your care-partner.     Handouts were given to you on diverticulosis and a hiatal hernia. Restart your iron tabs per Dr. Carlean Purl. You may resume your current medications today. Await biopsy results. Repeat colonoscopy in 7 years per Dr. Carlean Purl. Please call if any questions or concerns.

## 2019-07-31 NOTE — Progress Notes (Signed)
Robinal 0.1mg  IV given due large amount of secretions noted, vss

## 2019-07-31 NOTE — Progress Notes (Signed)
VS taken by DT   Pt's states no medical or surgical changes since previsit or office visit.

## 2019-07-31 NOTE — Op Note (Signed)
[place] Patient Name: Rachel Fitzpatrick Procedure Date: 07/31/2019 3:14 PM MRN: [id number] Endoscopist: Gatha Mayer , MD Age: 59 Referring MD:  Date of Birth: 06/25/1960 Gender: Female Account #: [id number] Procedure:                Colonoscopy Indications:              Positive fecal immunochemical test, Iron deficiency                            anemia Medicines:                Propofol per Anesthesia, Monitored Anesthesia Care Procedure:                Pre-Anesthesia Assessment:                           - Prior to the procedure, a History and Physical                            was performed, and patient medications and                            allergies were reviewed. The patient's tolerance of                            previous anesthesia was also reviewed. The risks                            and benefits of the procedure and the sedation                            options and risks were discussed with the patient.                            All questions were answered, and informed consent                            was obtained. Prior Anticoagulants: The patient has                            taken no previous anticoagulant or antiplatelet                            agents. ASA Grade Assessment: II - A patient with                            mild systemic disease. After reviewing the risks                            and benefits, the patient was deemed in                            satisfactory condition to undergo the procedure.  After obtaining informed consent, the colonoscope                            was passed under direct vision. Throughout the                            procedure, the patient's blood pressure, pulse, and                            oxygen saturations were monitored continuously. The                            Colonoscope was introduced through the anus and                            advanced to the the terminal  ileum, with                            identification of the appendiceal orifice and IC                            valve. The colonoscopy was performed without                            difficulty. The patient tolerated the procedure                            well. The quality of the bowel preparation was                            good. The terminal ileum, ileocecal valve,                            appendiceal orifice, and rectum were photographed. Scope In: 3:29:30 PM Scope Out: 3:41:36 PM Scope Withdrawal Time: 0 hours 7 minutes 11 seconds  Total Procedure Duration: 0 hours 12 minutes 6 seconds  Findings:                 The perianal and digital rectal examinations were                            normal.                           The terminal ileum appeared normal.                           Multiple diverticula were found in the sigmoid                            colon.                           The exam was otherwise without abnormality on  direct and retroflexion views. Complications:            No immediate complications. Estimated Blood Loss:     Estimated blood loss: none. Impression:               - The examined portion of the ileum was normal.                           - Diverticulosis in the sigmoid colon.                           - The examination was otherwise normal on direct                            and retroflexion views.                           - No specimens collected. Recommendation:           - Patient has a contact number available for                            emergencies. The signs and symptoms of potential                            delayed complications were discussed with the                            patient. Return to normal activities tomorrow.                            Written discharge instructions were provided to the                            patient.                           - Resume previous diet.                            - Continue present medications.                           - Repeat colonoscopy in 7 years. Remote hx of                            recxtal edenoma Gatha Mayer, MD 07/31/2019 4:07:28 PM This report has been signed electronically.

## 2019-08-02 ENCOUNTER — Telehealth: Payer: Self-pay

## 2019-08-02 NOTE — Telephone Encounter (Signed)
  Follow up Call-  Call back number 07/31/2019  Post procedure Call Back phone  # [phone]  Permission to leave phone message Yes  Some recent data might be hidden     Patient questions:  Do you have a fever, pain , or abdominal swelling? No. Pain Score  0 *  Have you tolerated food without any problems? Yes.    Have you been able to return to your normal activities? Yes.    Do you have any questions about your discharge instructions: Diet   No. Medications  No. Follow up visit  No.  Do you have questions or concerns about your Care? No.  Actions: * If pain score is 4 or above: No action needed, pain <4. 1. Have you developed a fever since your procedure? no  2.   Have you had an respiratory symptoms (SOB or cough) since your procedure? no  3.   Have you tested positive for COVID 19 since your procedure no  4.   Have you had any family members/close contacts diagnosed with the COVID 19 since your procedure?  no   If yes to any of these questions please route to Joylene John, RN and Erenest Rasher, RN

## 2019-08-06 ENCOUNTER — Encounter: Payer: Self-pay | Admitting: Internal Medicine

## 2019-08-21 ENCOUNTER — Encounter: Payer: Self-pay | Admitting: Family Medicine

## 2019-08-22 ENCOUNTER — Other Ambulatory Visit: Payer: Self-pay | Admitting: General Practice

## 2019-08-22 DIAGNOSIS — D649 Anemia, unspecified: Secondary | ICD-10-CM

## 2019-08-23 IMAGING — MG DIGITAL DIAGNOSTIC UNILATERAL LEFT MAMMOGRAM WITH TOMO AND CAD
6 series · 6 of 18 positions shown · non-contrast
Comparison: Previous exams including recent screening mammogram
dated 04/17/2018.

CLINICAL DATA: Patient returns today to evaluate a possible LEFT
breast asymmetry questioned on recent screening mammogram.

EXAM:
DIGITAL DIAGNOSTIC UNILATERAL LEFT MAMMOGRAM WITH CAD AND TOMO

[L CC synth-2D (1 of 2)]
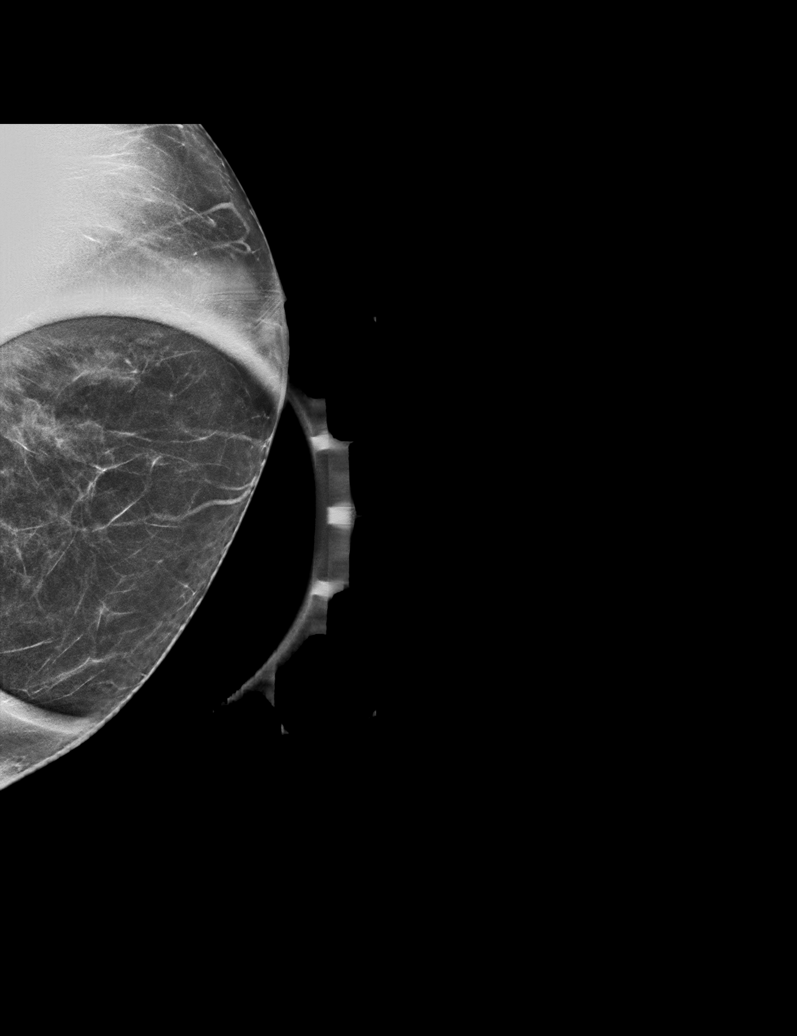

[L ML synth-2D]
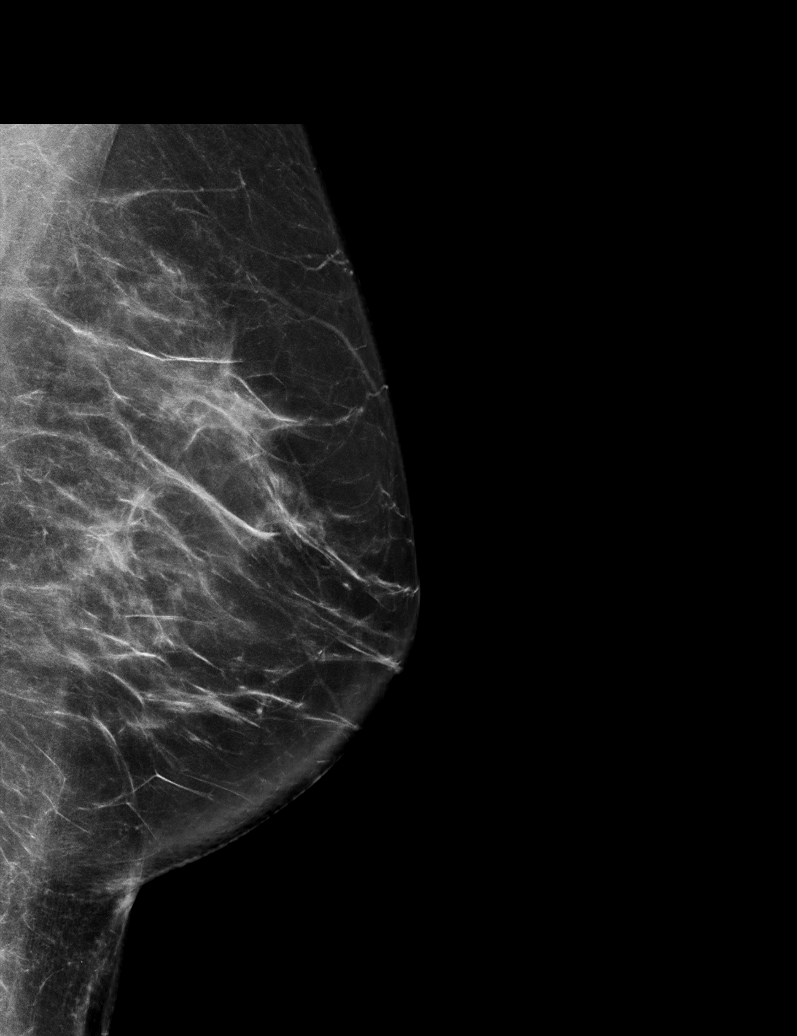

[L CC synth-2D (2 of 2)]
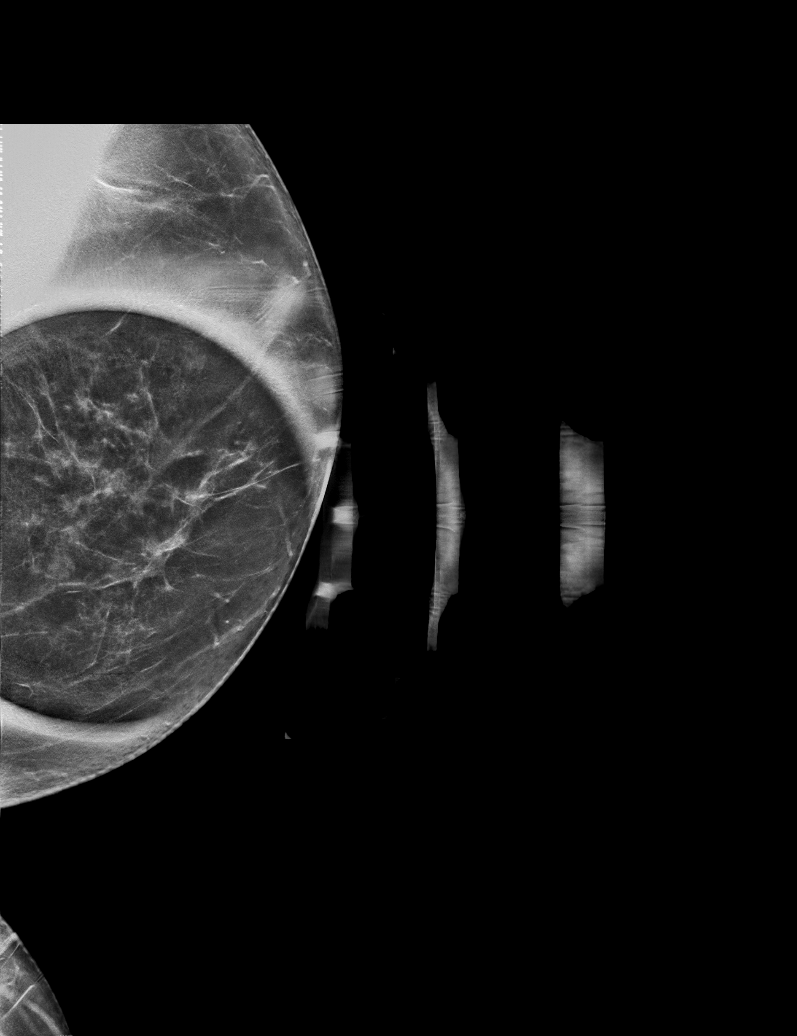

[L CC tomo (1 of 2) · tomo slice 28/55.0]
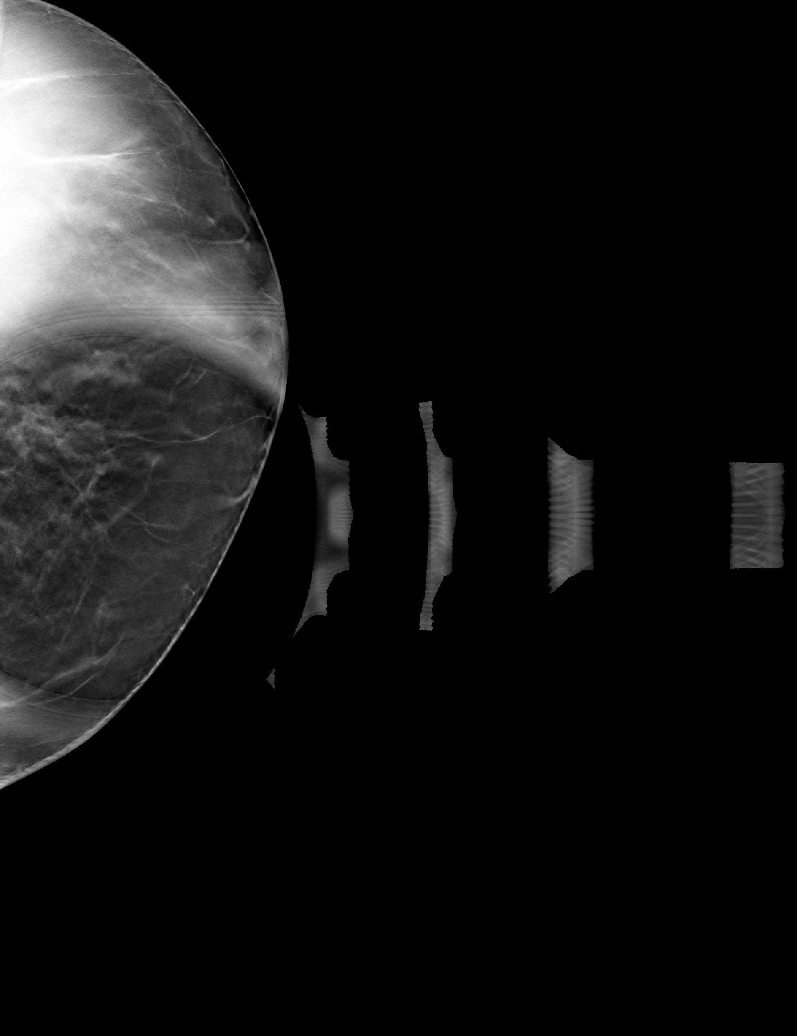

[L ML tomo · tomo slice 41/81.0]
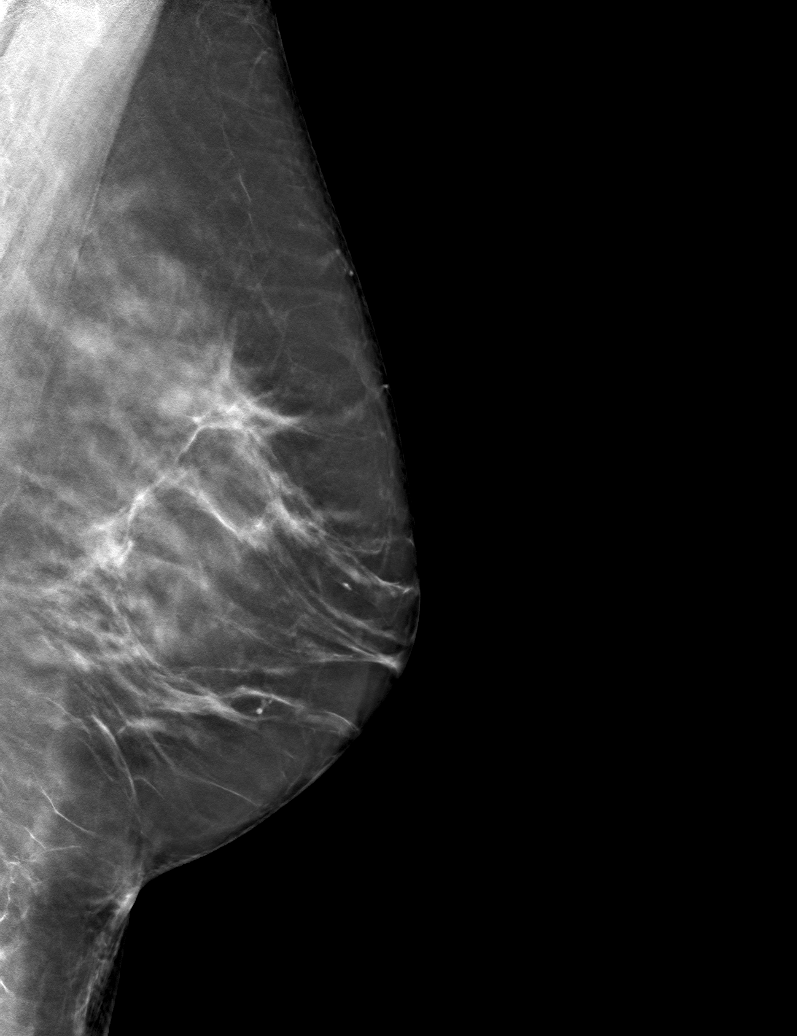

[L CC tomo (2 of 2) · tomo slice 35/70.0]
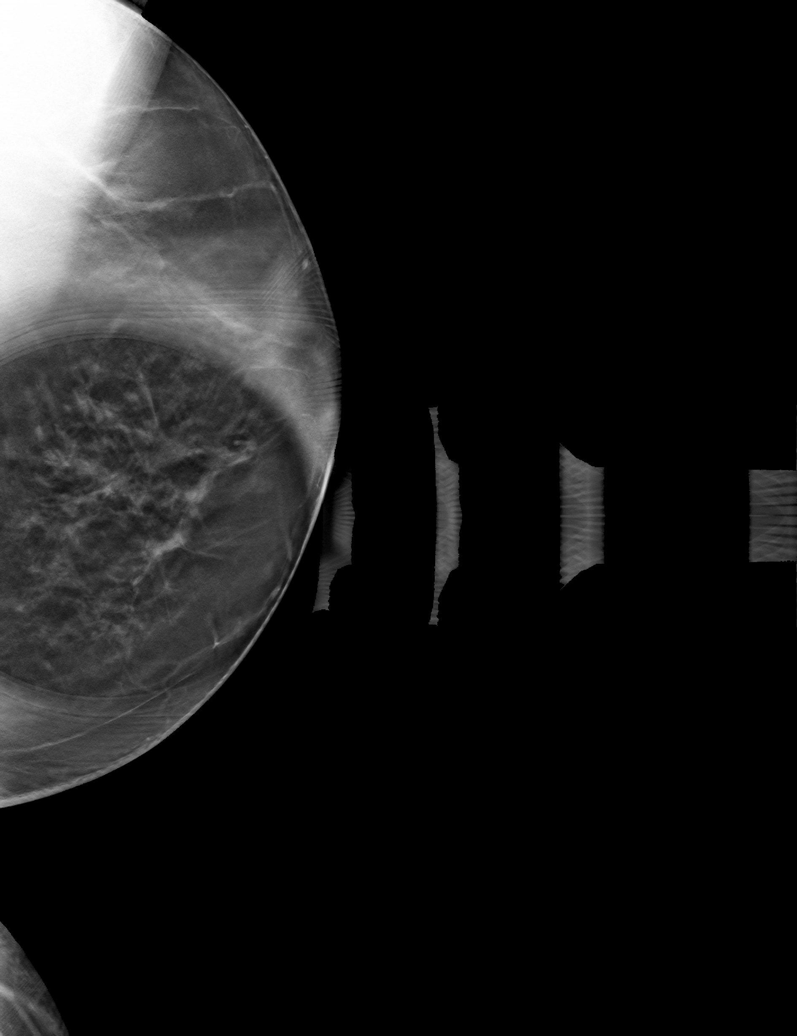

[6 of 18 positions shown; findings below may reference images not displayed]

ACR Breast Density Category c: The breast tissue is heterogeneously
dense, which may obscure small masses.
FINDINGS: On today's additional diagnostic views, including spot compression
with 3D tomosynthesis, there is no persistent asymmetry within the
inner LEFT breast indicating superimposition of normal
fibroglandular tissues. Fibroglandular pattern on today's exam is
stable compared to multiple prior studies, including screening
mammogram of 01/29/2016, confirming benignity.

Mammographic images were processed with CAD.
IMPRESSION: No evidence of malignancy. Patient may return to routine annual
bilateral screening mammogram schedule.

RECOMMENDATION:
Screening mammogram in one year.(Code:[id number])

I have discussed the findings and recommendations with the patient.
Results were also provided in writing at the conclusion of the
visit. If applicable, a reminder letter will be sent to the patient
regarding the next appointment.

BI-RADS CATEGORY  1: Negative.

## 2019-08-28 ENCOUNTER — Other Ambulatory Visit: Payer: Self-pay | Admitting: General Practice

## 2019-08-28 MED ORDER — ROSUVASTATIN CALCIUM 20 MG PO TABS: ORAL_TABLET | ORAL | 1 refills | Status: DC

## 2019-08-30 ENCOUNTER — Ambulatory Visit (INDEPENDENT_AMBULATORY_CARE_PROVIDER_SITE_OTHER): Payer: Managed Care, Other (non HMO) | Admitting: Family Medicine

## 2019-08-30 ENCOUNTER — Other Ambulatory Visit: Payer: Self-pay

## 2019-08-30 DIAGNOSIS — D649 Anemia, unspecified: Secondary | ICD-10-CM

## 2019-08-30 LAB — IBC PANEL
Iron: 51 ug/dL (ref 42–145)
Saturation Ratios: 9.9 % — ABNORMAL LOW (ref 20.0–50.0)
Transferrin: 368 mg/dL — ABNORMAL HIGH (ref 212.0–360.0)

## 2019-08-30 LAB — CBC WITH DIFFERENTIAL/PLATELET
Basophils Absolute: 0 10*3/uL (ref 0.0–0.1)
Basophils Relative: 0.4 % (ref 0.0–3.0)
Eosinophils Absolute: 0.1 10*3/uL (ref 0.0–0.7)
Eosinophils Relative: 1.5 % (ref 0.0–5.0)
HCT: 37.3 % (ref 36.0–46.0)
Hemoglobin: 12.4 g/dL (ref 12.0–15.0)
Lymphocytes Relative: 39.5 % (ref 12.0–46.0)
Lymphs Abs: 2.2 10*3/uL (ref 0.7–4.0)
MCHC: 33.4 g/dL (ref 30.0–36.0)
MCV: 86.7 fl (ref 78.0–100.0)
Monocytes Absolute: 0.3 10*3/uL (ref 0.1–1.0)
Monocytes Relative: 5.3 % (ref 3.0–12.0)
Neutro Abs: 3 10*3/uL (ref 1.4–7.7)
Neutrophils Relative %: 53.3 % (ref 43.0–77.0)
Platelets: 193 10*3/uL (ref 150.0–400.0)
RBC: 4.3 Mil/uL (ref 3.87–5.11)
RDW: 22.1 % — ABNORMAL HIGH (ref 11.5–15.5)
WBC: 5.7 10*3/uL (ref 4.0–10.5)

## 2019-09-18 ENCOUNTER — Telehealth: Payer: Managed Care, Other (non HMO) | Admitting: Emergency Medicine

## 2019-09-18 ENCOUNTER — Encounter: Payer: Self-pay | Admitting: Family Medicine

## 2019-09-18 DIAGNOSIS — J069 Acute upper respiratory infection, unspecified: Secondary | ICD-10-CM | POA: Diagnosis not present

## 2019-09-18 MED ORDER — BENZONATATE 100 MG PO CAPS
100.0000 mg | ORAL_CAPSULE | Freq: Two times a day (BID) | ORAL | 0 refills | Status: DC | PRN
Start: 2019-09-18 — End: 2019-12-14

## 2019-09-18 MED ORDER — ALBUTEROL SULFATE HFA 108 (90 BASE) MCG/ACT IN AERS
2.0000 | INHALATION_SPRAY | RESPIRATORY_TRACT | 0 refills | Status: DC | PRN
Start: 2019-09-18 — End: 2019-12-14

## 2019-09-18 MED ORDER — AZITHROMYCIN 250 MG PO TABS
ORAL_TABLET | ORAL | 0 refills | Status: DC
Start: 2019-09-18 — End: 2019-12-14

## 2019-09-18 MED ORDER — PREDNISONE 20 MG PO TABS
20.0000 mg | ORAL_TABLET | Freq: Every day | ORAL | 0 refills | Status: DC
Start: 2019-09-18 — End: 2019-12-14

## 2019-09-18 NOTE — Progress Notes (Signed)
We are sorry you are not feeling well.  Here is how we plan to help!  Based on what you have shared with me an upper respiratory infection.  Most of the time these are caused by viruses, but given the length of your symptoms, you may benefit from antibiotic therapy.  I have prescribed: 1. Azithromycin, an antibiotic.  Take 2 pills on day 1, then 1 pill daily until gone. 2. Prednisone - Take 20 mg daily for 5 days. 3. Inhaler - Use 2 puffs every 4 hours as needed. 4. Tessalon perles - for cough, take 1 tablet morning and evening.  Upper respiratory infections are caused by a large number of viruses; however, rhinovirus is the most common cause.   Symptoms vary from person to person, with common symptoms including sore throat, cough, fatigue or lack of energy and feeling of general discomfort.  A low-grade fever of up to 100.4 may present, but is often uncommon.  Symptoms vary however, and are closely related to a person's age or underlying illnesses.  The most common symptoms associated with an upper respiratory infection are nasal discharge or congestion, cough, sneezing, headache and pressure in the ears and face.  These symptoms usually persist for about 3 to 10 days, but can last up to 2 weeks.  It is important to know that upper respiratory infections do not cause serious illness or complications in most cases.    Upper respiratory infections can be transmitted from person to person, with the most common method of transmission being a person's hands.  The virus is able to live on the skin and can infect other persons for up to 2 hours after direct contact.  Also, these can be transmitted when someone coughs or sneezes; thus, it is important to cover the mouth to reduce this risk.  To keep the spread of the illness at [place], good hand hygiene is very important.  This is an infection that is most likely caused by a virus. There are no specific treatments other than to help you with the symptoms until  the infection runs its course.  We are sorry you are not feeling well.  Here is how we plan to help!   For nasal congestion, you may use an oral decongestants such as Mucinex D or if you have glaucoma or high blood pressure use plain Mucinex.  Saline nasal spray or nasal drops can help and can safely be used as often as needed for congestion. If you do not have a history of heart disease, hypertension, diabetes or thyroid disease, prostate/bladder issues or glaucoma, you may also use Sudafed to treat nasal congestion.  It is highly recommended that you consult with a pharmacist or your primary care physician to ensure this medication is safe for you to take.     If you have a cough, you may use cough suppressants such as Delsym and Robitussin.  If you have glaucoma or high blood pressure, you can also use Coricidin HBP.    If you have a sore or scratchy throat, use a saltwater gargle-  to  teaspoon of salt dissolved in a 4-ounce to 8-ounce glass of warm water.  Gargle the solution for approximately 15-30 seconds and then spit.  It is important not to swallow the solution.  You can also use throat lozenges/cough drops and Chloraseptic spray to help with throat pain or discomfort.  Warm or cold liquids can also be helpful in relieving throat pain.  For headache, pain  or general discomfort, you can use Ibuprofen or Tylenol as directed.   Some authorities believe that zinc sprays or the use of Echinacea may shorten the course of your symptoms.   HOME CARE . Only take medications as instructed by your medical team. . Be sure to drink plenty of fluids. Water is fine as well as fruit juices, sodas and electrolyte beverages. You may want to stay away from caffeine or alcohol. If you are nauseated, try taking small sips of liquids. How do you know if you are getting enough fluid? Your urine should be a pale yellow or almost colorless. . Get rest. . Taking a steamy shower or using a humidifier may help  nasal congestion and ease sore throat pain. You can place a towel over your head and breathe in the steam from hot water coming from a faucet. . Using a saline nasal spray works much the same way. . Cough drops, hard candies and sore throat lozenges may ease your cough. . Avoid close contacts especially the very young and the elderly . Cover your mouth if you cough or sneeze . Always remember to wash your hands.   GET HELP RIGHT AWAY IF: . You develop worsening fever. . If your symptoms do not improve within 10 days . You develop yellow or green discharge from your nose over 3 days. . You have coughing fits . You develop a severe head ache or visual changes. . You develop shortness of breath, difficulty breathing or start having chest pain . Your symptoms persist after you have completed your treatment plan  MAKE SURE YOU   Understand these instructions.  Will watch your condition.  Will get help right away if you are not doing well or get worse.  Your e-visit answers were reviewed by a board certified advanced clinical practitioner to complete your personal care plan. Depending upon the condition, your plan could have included both over the counter or prescription medications. Please review your pharmacy choice. If there is a problem, you may call our nursing hot line at and have the prescription routed to another pharmacy. Your safety is important to [place]. If you have drug allergies check your prescription carefully.   You can use MyChart to ask questions about today's visit, request a non-urgent call back, or ask for a work or school excuse for 24 hours related to this e-Visit. If it has been greater than 24 hours you will need to follow up with your provider, or enter a new e-Visit to address those concerns. You will get an e-mail in the next two days asking about your experience.  I hope that your e-visit has been valuable and will speed your recovery. Thank you for using  e-visits.     Approximately 5 minutes was used in reviewing the patient's chart, questionnaire, prescribing medications, and documentation.

## 2019-11-15 ENCOUNTER — Other Ambulatory Visit: Payer: Self-pay | Admitting: Family Medicine

## 2019-11-21 ENCOUNTER — Other Ambulatory Visit: Payer: Self-pay | Admitting: Family Medicine

## 2019-12-14 ENCOUNTER — Other Ambulatory Visit: Payer: Self-pay

## 2019-12-14 ENCOUNTER — Ambulatory Visit (INDEPENDENT_AMBULATORY_CARE_PROVIDER_SITE_OTHER): Payer: Managed Care, Other (non HMO) | Admitting: Family Medicine

## 2019-12-14 ENCOUNTER — Encounter: Payer: Self-pay | Admitting: Family Medicine

## 2019-12-14 VITALS — BP 138/83 | HR 84 | Temp 97.6°F | Resp 16 | Ht 61.0 in | Wt 167.5 lb

## 2019-12-14 DIAGNOSIS — Z23 Encounter for immunization: Secondary | ICD-10-CM

## 2019-12-14 DIAGNOSIS — E669 Obesity, unspecified: Secondary | ICD-10-CM | POA: Diagnosis not present

## 2019-12-14 DIAGNOSIS — E785 Hyperlipidemia, unspecified: Secondary | ICD-10-CM

## 2019-12-14 DIAGNOSIS — D509 Iron deficiency anemia, unspecified: Secondary | ICD-10-CM

## 2019-12-14 DIAGNOSIS — R000 Tachycardia, unspecified: Secondary | ICD-10-CM | POA: Diagnosis not present

## 2019-12-14 LAB — CBC WITH DIFFERENTIAL/PLATELET
Basophils Absolute: 0 10*3/uL (ref 0.0–0.1)
Basophils Relative: 0.7 % (ref 0.0–3.0)
Eosinophils Absolute: 0.1 10*3/uL (ref 0.0–0.7)
Eosinophils Relative: 1.9 % (ref 0.0–5.0)
HCT: 36.3 % (ref 36.0–46.0)
Hemoglobin: 12.3 g/dL (ref 12.0–15.0)
Lymphocytes Relative: 42.4 % (ref 12.0–46.0)
Lymphs Abs: 1.9 10*3/uL (ref 0.7–4.0)
MCHC: 33.8 g/dL (ref 30.0–36.0)
MCV: 92.1 fl (ref 78.0–100.0)
Monocytes Absolute: 0.3 10*3/uL (ref 0.1–1.0)
Monocytes Relative: 6.9 % (ref 3.0–12.0)
Neutro Abs: 2.1 10*3/uL (ref 1.4–7.7)
Neutrophils Relative %: 48.1 % (ref 43.0–77.0)
Platelets: 211 10*3/uL (ref 150.0–400.0)
RBC: 3.94 Mil/uL (ref 3.87–5.11)
RDW: 13.8 % (ref 11.5–15.5)
WBC: 4.5 10*3/uL (ref 4.0–10.5)

## 2019-12-14 LAB — TSH: TSH: 1.73 u[IU]/mL (ref 0.35–4.50)

## 2019-12-14 LAB — LIPID PANEL
Cholesterol: 281 mg/dL — ABNORMAL HIGH (ref 0–200)
HDL: 115.3 mg/dL (ref >39.00)
LDL Cholesterol: 148 mg/dL — ABNORMAL HIGH (ref 0–99)
NonHDL: 165.26
Total CHOL/HDL Ratio: 2
Triglycerides: 88 mg/dL (ref 0.0–149.0)
VLDL: 17.6 mg/dL (ref 0.0–40.0)

## 2019-12-14 LAB — HEPATIC FUNCTION PANEL
ALT: 11 U/L (ref 0–35)
AST: 22 U/L (ref 0–37)
Albumin: 4.5 g/dL (ref 3.5–5.2)
Alkaline Phosphatase: 52 U/L (ref 39–117)
Bilirubin, Direct: 0.1 mg/dL (ref 0.0–0.3)
Total Bilirubin: 0.4 mg/dL (ref 0.2–1.2)
Total Protein: 7.2 g/dL (ref 6.0–8.3)

## 2019-12-14 LAB — BASIC METABOLIC PANEL
BUN: 23 mg/dL (ref 6–23)
CO2: 26 mEq/L (ref 19–32)
Calcium: 9.3 mg/dL (ref 8.4–10.5)
Chloride: 104 mEq/L (ref 96–112)
Creatinine, Ser: 0.71 mg/dL (ref 0.40–1.20)
GFR: 93.95 mL/min (ref >60.00)
Glucose, Bld: 94 mg/dL (ref 70–99)
Potassium: 4.7 mEq/L (ref 3.5–5.1)
Sodium: 138 mEq/L (ref 135–145)

## 2019-12-14 NOTE — Assessment & Plan Note (Signed)
Noted in April.  Pt had GI workup and was started on daily iron supplement.  Will repeat iron panel and see if she needs to continue iron.

## 2019-12-14 NOTE — Assessment & Plan Note (Signed)
Ongoing issue for pt.  Again discussed need for healthy diet and regular exercise.  Check labs to risk stratify.

## 2019-12-14 NOTE — Progress Notes (Signed)
   Subjective:    Patient ID: Rachel Fitzpatrick, female    DOB: 20-Jun-1960, 59 y.o.   MRN: [id number]  HPI Hyperlipidemia- chronic problem, on Crestor 20mg  daily.  No CP, SOB, HAs, abd pain, N/V.  Obesity- BMI is 31.65.  Pt reports things have become more difficult w/ working from home.  'I eat too much, I drink too much, I work too much'.  No regular exercise.  Tachycardia- HR 146 this AM.  No coffee this AM.  Pt was trying to do a virtual meeting while vitals were being done.  No known palpitations.  No dizziness.  Anemia- pt's hgb was 9.8 in April.  She was started on iron supplement.  She is asking if she needs to continue.   Review of Systems For ROS see HPI   This visit occurred during the SARS-CoV-2 public health emergency.  Safety protocols were in place, including screening questions prior to the visit, additional usage of staff PPE, and extensive cleaning of exam room while observing appropriate contact time as indicated for disinfecting solutions.       Objective:   Physical Exam Vitals reviewed.  Constitutional:      General: She is not in acute distress.    Appearance: She is well-developed. She is obese.  HENT:     Head: Normocephalic and atraumatic.  Eyes:     Conjunctiva/sclera: Conjunctivae normal.     Pupils: Pupils are equal, round, and reactive to light.  Neck:     Thyroid: No thyromegaly.  Cardiovascular:     Rate and Rhythm: Normal rate and regular rhythm.     Heart sounds: Normal heart sounds. No murmur heard.   Pulmonary:     Effort: Pulmonary effort is normal. No respiratory distress.     Breath sounds: Normal breath sounds.  Abdominal:     General: There is no distension.     Palpations: Abdomen is soft.     Tenderness: There is no abdominal tenderness.  Musculoskeletal:     Cervical back: Normal range of motion and neck supple.     Right lower leg: No edema.     Left lower leg: No edema.  Lymphadenopathy:     Cervical: No cervical adenopathy.    Skin:    General: Skin is warm and dry.  Neurological:     Mental Status: She is alert and oriented to person, place, and time.  Psychiatric:        Behavior: Behavior normal.           Assessment & Plan:  Tachycardia- new.  Resolved during the appt and returned to normal.  Pt feels it was stress related b/c she was trying to do a virtual meeting while vitals were being taken.

## 2019-12-14 NOTE — Patient Instructions (Signed)
Schedule your complete physical in 6 months We'll notify you of your lab results and make any changes if needed Make sure you are taking time for yourself and for physical activity Call with any questions or concerns Stay Safe!!  Stay Healthy!!

## 2019-12-14 NOTE — Assessment & Plan Note (Signed)
Chronic problem.  Tolerating Crestor 20mg  daily w/o difficulty.  Admits that she has not been eating right or exercising and is certain her cholesterol will be worse.  Stressed need for healthy diet and regular exercise.  Check labs.  Adjust meds prn

## 2019-12-15 LAB — IRON,TIBC AND FERRITIN PANEL
%SAT: 11 % (calc) — ABNORMAL LOW (ref 16–45)
Ferritin: 19 ng/mL (ref 16–232)
Iron: 49 ug/dL (ref 45–160)
TIBC: 450 mcg/dL (calc) (ref 250–450)

## 2019-12-17 ENCOUNTER — Encounter: Payer: Self-pay | Admitting: General Practice

## 2020-01-10 ENCOUNTER — Other Ambulatory Visit: Payer: Self-pay | Admitting: Family Medicine

## 2020-02-25 ENCOUNTER — Other Ambulatory Visit: Payer: Self-pay | Admitting: Family Medicine

## 2020-06-04 ENCOUNTER — Telehealth: Payer: Self-pay | Admitting: Family Medicine

## 2020-06-04 ENCOUNTER — Ambulatory Visit: Payer: Managed Care, Other (non HMO) | Admitting: Family Medicine

## 2020-06-04 NOTE — Telephone Encounter (Signed)
Pt is aware.  

## 2020-06-04 NOTE — Telephone Encounter (Signed)
As long as she keeps her mouth and nose covered at all times while in the office

## 2020-06-04 NOTE — Telephone Encounter (Signed)
Pt called in asking for an in office visit, she states that she has a dry cough and a headache. She states that she done a video visit with her work and she still isn't any better. She states that she took a covid test and it was negative.   Can we do an in office visit?   Pt is scheduled at 11am

## 2020-06-20 ENCOUNTER — Ambulatory Visit (INDEPENDENT_AMBULATORY_CARE_PROVIDER_SITE_OTHER): Payer: Managed Care, Other (non HMO) | Admitting: Family Medicine

## 2020-06-20 ENCOUNTER — Encounter: Payer: Self-pay | Admitting: Family Medicine

## 2020-06-20 ENCOUNTER — Other Ambulatory Visit: Payer: Self-pay

## 2020-06-20 VITALS — BP 120/70 | HR 87 | Temp 97.7°F | Resp 20 | Ht 62.0 in | Wt 167.0 lb

## 2020-06-20 DIAGNOSIS — Z0000 Encounter for general adult medical examination without abnormal findings: Secondary | ICD-10-CM | POA: Diagnosis not present

## 2020-06-20 DIAGNOSIS — M810 Age-related osteoporosis without current pathological fracture: Secondary | ICD-10-CM

## 2020-06-20 DIAGNOSIS — Z23 Encounter for immunization: Secondary | ICD-10-CM

## 2020-06-20 DIAGNOSIS — Z1231 Encounter for screening mammogram for malignant neoplasm of breast: Secondary | ICD-10-CM | POA: Diagnosis not present

## 2020-06-20 DIAGNOSIS — E669 Obesity, unspecified: Secondary | ICD-10-CM

## 2020-06-20 DIAGNOSIS — Z114 Encounter for screening for human immunodeficiency virus [HIV]: Secondary | ICD-10-CM

## 2020-06-20 DIAGNOSIS — Z1159 Encounter for screening for other viral diseases: Secondary | ICD-10-CM

## 2020-06-20 LAB — CBC WITH DIFFERENTIAL/PLATELET
Basophils Absolute: 0 10*3/uL (ref 0.0–0.1)
Basophils Relative: 0.7 % (ref 0.0–3.0)
Eosinophils Absolute: 0.1 10*3/uL (ref 0.0–0.7)
Eosinophils Relative: 2.7 % (ref 0.0–5.0)
HCT: 37.6 % (ref 36.0–46.0)
Hemoglobin: 12.8 g/dL (ref 12.0–15.0)
Lymphocytes Relative: 53 % — ABNORMAL HIGH (ref 12.0–46.0)
Lymphs Abs: 2.1 10*3/uL (ref 0.7–4.0)
MCHC: 34.1 g/dL (ref 30.0–36.0)
MCV: 91.3 fl (ref 78.0–100.0)
Monocytes Absolute: 0.4 10*3/uL (ref 0.1–1.0)
Monocytes Relative: 9.9 % (ref 3.0–12.0)
Neutro Abs: 1.3 10*3/uL — ABNORMAL LOW (ref 1.4–7.7)
Neutrophils Relative %: 33.7 % — ABNORMAL LOW (ref 43.0–77.0)
Platelets: 163 10*3/uL (ref 150.0–400.0)
RBC: 4.12 Mil/uL (ref 3.87–5.11)
RDW: 14.1 % (ref 11.5–15.5)
WBC: 4 10*3/uL (ref 4.0–10.5)

## 2020-06-20 LAB — BASIC METABOLIC PANEL
BUN: 25 mg/dL — ABNORMAL HIGH (ref 6–23)
CO2: 23 mEq/L (ref 19–32)
Calcium: 9.2 mg/dL (ref 8.4–10.5)
Chloride: 105 mEq/L (ref 96–112)
Creatinine, Ser: 0.77 mg/dL (ref 0.40–1.20)
GFR: 85.04 mL/min (ref >60.00)
Glucose, Bld: 98 mg/dL (ref 70–99)
Potassium: 4.5 mEq/L (ref 3.5–5.1)
Sodium: 140 mEq/L (ref 135–145)

## 2020-06-20 LAB — LIPID PANEL
Cholesterol: 228 mg/dL — ABNORMAL HIGH (ref 0–200)
HDL: 98 mg/dL (ref >39.00)
LDL Cholesterol: 113 mg/dL — ABNORMAL HIGH (ref 0–99)
NonHDL: 130
Total CHOL/HDL Ratio: 2
Triglycerides: 86 mg/dL (ref 0.0–149.0)
VLDL: 17.2 mg/dL (ref 0.0–40.0)

## 2020-06-20 LAB — HEPATIC FUNCTION PANEL
ALT: 12 U/L (ref 0–35)
AST: 24 U/L (ref 0–37)
Albumin: 4.2 g/dL (ref 3.5–5.2)
Alkaline Phosphatase: 59 U/L (ref 39–117)
Bilirubin, Direct: 0 mg/dL (ref 0.0–0.3)
Total Bilirubin: 0.4 mg/dL (ref 0.2–1.2)
Total Protein: 7.2 g/dL (ref 6.0–8.3)

## 2020-06-20 LAB — VITAMIN D 25 HYDROXY (VIT D DEFICIENCY, FRACTURES): VITD: 52.51 ng/mL (ref 30.00–100.00)

## 2020-06-20 LAB — TSH: TSH: 1.62 u[IU]/mL (ref 0.35–4.50)

## 2020-06-20 NOTE — Progress Notes (Signed)
   Subjective:    Patient ID: Rachel Fitzpatrick, female    DOB: 01/25/1961, 60 y.o.   MRN: [id number]  HPI CPE- UTD on pap, colonoscopy, flu shot, COVID.  Due for mammo, Tdap, Hep C, HIV  Reviewed past medical, surgical, family and social histories.   Patient Care Team    Relationship Specialty Notifications Start End  Midge Minium, MD PCP - General Family Medicine  05/08/14   Molli Posey, MD Consulting Physician Obstetrics and Gynecology  06/10/14     Health Maintenance  Topic Date Due  . Hepatitis C Screening  Never done  . HIV Screening  Never done  . MAMMOGRAM  06/07/2020  . COVID-19 Vaccine (2 - Booster for Janssen series) 07/06/2020 (Originally 08/02/2019)  . TETANUS/TDAP  12/13/2020 (Originally 08/08/2019)  . INFLUENZA VACCINE  09/29/2020  . PAP SMEAR-Modifier  08/30/2022  . COLONOSCOPY (Pts 45-44yrs Insurance coverage will need to be confirmed)  07/31/2026  . HPV VACCINES  Aged Out      Review of Systems Patient reports no vision/ hearing changes, adenopathy,fever, weight change,  persistant/recurrent hoarseness , swallowing issues, chest pain, palpitations, edema, persistant/recurrent cough, hemoptysis, dyspnea (rest/exertional/paroxysmal nocturnal), gastrointestinal bleeding (melena, rectal bleeding), abdominal pain, significant heartburn, bowel changes, GU symptoms (dysuria, hematuria, incontinence), Gyn symptoms (abnormal  bleeding, pain),  syncope, focal weakness, memory loss, numbness & tingling, skin/hair/nail changes, abnormal bruising or bleeding, anxiety, or depression.   This visit occurred during the SARS-CoV-2 public health emergency.  Safety protocols were in place, including screening questions prior to the visit, additional usage of staff PPE, and extensive cleaning of exam room while observing appropriate contact time as indicated for disinfecting solutions.       Objective:   Physical Exam General Appearance:    Alert, cooperative, no distress, appears  stated age  Head:    Normocephalic, without obvious abnormality, atraumatic  Eyes:    PERRL, conjunctiva/corneas clear, EOM's intact, fundi    benign, both eyes  Ears:    Normal TM's and external ear canals, both ears  Nose:   Deferred due to COVID  Throat:   Neck:   Supple, symmetrical, trachea midline, no adenopathy;    Thyroid: no enlargement/tenderness/nodules  Back:     Symmetric, no curvature, ROM normal, no CVA tenderness  Lungs:     Clear to auscultation bilaterally, respirations unlabored  Chest Wall:    No tenderness or deformity   Heart:    Regular rate and rhythm, S1 and S2 normal, no murmur, rub   or gallop  Breast Exam:    Deferred to GYN  Abdomen:     Soft, non-tender, bowel sounds active all four quadrants,    no masses, no organomegaly  Genitalia:    Deferred to GYN  Rectal:    Extremities:   Extremities normal, atraumatic, no cyanosis or edema  Pulses:   2+ and symmetric all extremities  Skin:   Skin color, texture, turgor normal, no rashes or lesions  Lymph nodes:   Cervical, supraclavicular, and axillary nodes normal  Neurologic:   CNII-XII intact, normal strength, sensation and reflexes    throughout          Assessment & Plan:

## 2020-06-20 NOTE — Assessment & Plan Note (Signed)
Pt's PE WNL w/ exception of obesity.  UTD on colonoscopy, pap.  Due for mammo and DEXA (ordered).  Tdap updated.  Check labs.  Anticipatory guidance provided.

## 2020-06-20 NOTE — Patient Instructions (Signed)
Follow up in 6 months to recheck cholesterol We'll notify you of your lab results and make any changes if needed Continue to work on healthy diet and regular exercise- you can do it! We'll call you with your bone density and mammo Call with any questions or concerns Stay Safe!  Stay Healthy! Happy Spring!!!!

## 2020-06-20 NOTE — Addendum Note (Signed)
Addended by: Genevie Cheshire L on: 06/20/2020 08:21 AM   Modules accepted: Orders

## 2020-06-20 NOTE — Assessment & Plan Note (Signed)
Due for repeat DEXA (ordered).  Check Vit D.

## 2020-06-20 NOTE — Assessment & Plan Note (Signed)
Ongoing issue for pt.  BMI is 30.54  Encouraged healthy diet, regular exercise.  Pt has had success with WW in the past, encouraged her to reconsider.  Check labs to risk stratify.  Will follow.

## 2020-06-23 LAB — HIV ANTIBODY (ROUTINE TESTING W REFLEX): HIV 1&2 Ab, 4th Generation: NONREACTIVE

## 2020-06-23 LAB — HEPATITIS C ANTIBODY
Hepatitis C Ab: NONREACTIVE
SIGNAL TO CUT-OFF: 0.01 (ref <1.00)

## 2020-07-13 ENCOUNTER — Other Ambulatory Visit: Payer: Self-pay | Admitting: Family Medicine

## 2020-07-16 ENCOUNTER — Other Ambulatory Visit: Payer: Self-pay | Admitting: Family Medicine

## 2020-07-16 DIAGNOSIS — Z1231 Encounter for screening mammogram for malignant neoplasm of breast: Secondary | ICD-10-CM

## 2020-07-16 DIAGNOSIS — M810 Age-related osteoporosis without current pathological fracture: Secondary | ICD-10-CM

## 2020-08-27 ENCOUNTER — Encounter: Payer: Self-pay | Admitting: *Deleted

## 2020-11-19 ENCOUNTER — Other Ambulatory Visit: Payer: Self-pay | Admitting: Family Medicine

## 2020-12-01 ENCOUNTER — Other Ambulatory Visit: Payer: Self-pay | Admitting: Family Medicine

## 2020-12-19 ENCOUNTER — Other Ambulatory Visit: Payer: Self-pay

## 2020-12-19 ENCOUNTER — Encounter: Payer: Self-pay | Admitting: Family Medicine

## 2020-12-19 ENCOUNTER — Ambulatory Visit (INDEPENDENT_AMBULATORY_CARE_PROVIDER_SITE_OTHER): Payer: Managed Care, Other (non HMO) | Admitting: Family Medicine

## 2020-12-19 VITALS — BP 102/68 | HR 93 | Temp 98.1°F | Resp 16 | Wt 162.8 lb

## 2020-12-19 DIAGNOSIS — E669 Obesity, unspecified: Secondary | ICD-10-CM

## 2020-12-19 DIAGNOSIS — E785 Hyperlipidemia, unspecified: Secondary | ICD-10-CM | POA: Diagnosis not present

## 2020-12-19 LAB — LIPID PANEL
Cholesterol: 247 mg/dL — ABNORMAL HIGH (ref 0–200)
HDL: 122.6 mg/dL (ref >39.00)
LDL Cholesterol: 93 mg/dL (ref 0–99)
NonHDL: 124.88
Total CHOL/HDL Ratio: 2
Triglycerides: 161 mg/dL — ABNORMAL HIGH (ref 0.0–149.0)
VLDL: 32.2 mg/dL (ref 0.0–40.0)

## 2020-12-19 LAB — BASIC METABOLIC PANEL
BUN: 24 mg/dL — ABNORMAL HIGH (ref 6–23)
CO2: 26 mEq/L (ref 19–32)
Calcium: 9.5 mg/dL (ref 8.4–10.5)
Chloride: 103 mEq/L (ref 96–112)
Creatinine, Ser: 0.76 mg/dL (ref 0.40–1.20)
GFR: 86.08 mL/min (ref >60.00)
Glucose, Bld: 102 mg/dL — ABNORMAL HIGH (ref 70–99)
Potassium: 4.2 mEq/L (ref 3.5–5.1)
Sodium: 138 mEq/L (ref 135–145)

## 2020-12-19 LAB — HEPATIC FUNCTION PANEL
ALT: 9 U/L (ref 0–35)
AST: 19 U/L (ref 0–37)
Albumin: 4.3 g/dL (ref 3.5–5.2)
Alkaline Phosphatase: 62 U/L (ref 39–117)
Bilirubin, Direct: 0.1 mg/dL (ref 0.0–0.3)
Total Bilirubin: 0.4 mg/dL (ref 0.2–1.2)
Total Protein: 7.1 g/dL (ref 6.0–8.3)

## 2020-12-19 LAB — CBC WITH DIFFERENTIAL/PLATELET
Basophils Absolute: 0 10*3/uL (ref 0.0–0.1)
Basophils Relative: 0.7 % (ref 0.0–3.0)
Eosinophils Absolute: 0.1 10*3/uL (ref 0.0–0.7)
Eosinophils Relative: 2.1 % (ref 0.0–5.0)
HCT: 38.1 % (ref 36.0–46.0)
Hemoglobin: 12.5 g/dL (ref 12.0–15.0)
Lymphocytes Relative: 35.2 % (ref 12.0–46.0)
Lymphs Abs: 1.5 10*3/uL (ref 0.7–4.0)
MCHC: 32.9 g/dL (ref 30.0–36.0)
MCV: 93.9 fl (ref 78.0–100.0)
Monocytes Absolute: 0.3 10*3/uL (ref 0.1–1.0)
Monocytes Relative: 6.9 % (ref 3.0–12.0)
Neutro Abs: 2.4 10*3/uL (ref 1.4–7.7)
Neutrophils Relative %: 55.1 % (ref 43.0–77.0)
Platelets: 202 10*3/uL (ref 150.0–400.0)
RBC: 4.05 Mil/uL (ref 3.87–5.11)
RDW: 13.7 % (ref 11.5–15.5)
WBC: 4.4 10*3/uL (ref 4.0–10.5)

## 2020-12-19 LAB — TSH: TSH: 1.61 u[IU]/mL (ref 0.35–5.50)

## 2020-12-19 NOTE — Progress Notes (Signed)
   Subjective:    Patient ID: Rachel Fitzpatrick, female    DOB: [birth year], 60 y.o.   MRN: [id number]  HPI Hyperlipidemia- chronic problem, on Crestor 20mg  daily.  Denies CP, SOB, abd pain, N/V.  Obesity- pt is down 5 lbs since last visit.  BMI is no 29.78  Pt reports feeling good.  Occasionally walking.  Is working on lifestyle changes.  UTD on flu shot, 1st shingles   Review of Systems For ROS see HPI   This visit occurred during the SARS-CoV-2 public health emergency.  Safety protocols were in place, including screening questions prior to the visit, additional usage of staff PPE, and extensive cleaning of exam room while observing appropriate contact time as indicated for disinfecting solutions.      Objective:   Physical Exam Vitals reviewed.  Constitutional:      General: She is not in acute distress.    Appearance: Normal appearance. She is well-developed. She is not ill-appearing.  HENT:     Head: Normocephalic and atraumatic.  Eyes:     Conjunctiva/sclera: Conjunctivae normal.     Pupils: Pupils are equal, round, and reactive to light.  Neck:     Thyroid: No thyromegaly.  Cardiovascular:     Rate and Rhythm: Normal rate and regular rhythm.     Pulses: Normal pulses.     Heart sounds: Normal heart sounds. No murmur heard. Pulmonary:     Effort: Pulmonary effort is normal. No respiratory distress.     Breath sounds: Normal breath sounds.  Abdominal:     General: There is no distension.     Palpations: Abdomen is soft.     Tenderness: There is no abdominal tenderness.  Musculoskeletal:     Cervical back: Normal range of motion and neck supple.     Right lower leg: No edema.     Left lower leg: No edema.  Lymphadenopathy:     Cervical: No cervical adenopathy.  Skin:    General: Skin is warm and dry.  Neurological:     Mental Status: She is alert and oriented to person, place, and time.  Psychiatric:        Behavior: Behavior normal.          Assessment &  Plan:

## 2020-12-19 NOTE — Patient Instructions (Signed)
Schedule your complete physical in 6 months We'll notify you of your lab results and make any changes if needed Keep up the good work on healthy diet and regular exercise- you look great!! Have them send me copies of the mammo and bone density Call with any questions or concerns Stay Safe! Stay Healthy! ENJOY THE CELEBRATIONS!!!!

## 2020-12-19 NOTE — Assessment & Plan Note (Signed)
Pt is down 5 lbs and BMI is now 29.78 and out of the obese range!  Applauded her efforts and encouraged her to continue.  Will follow.

## 2020-12-19 NOTE — Assessment & Plan Note (Signed)
Chronic problem.  Tolerating statin w/o difficulty.  Check labs.  Adjust meds prn  

## 2020-12-26 ENCOUNTER — Other Ambulatory Visit: Payer: Managed Care, Other (non HMO)

## 2020-12-26 ENCOUNTER — Ambulatory Visit: Payer: Managed Care, Other (non HMO)

## 2021-01-01 ENCOUNTER — Ambulatory Visit
Admission: RE | Admit: 2021-01-01 | Discharge: 2021-01-01 | Disposition: A | Discharge status: Home / Self Care | Payer: Managed Care, Other (non HMO) | Source: Ambulatory Visit | Attending: Family Medicine | Admitting: Family Medicine

## 2021-01-01 DIAGNOSIS — Z1231 Encounter for screening mammogram for malignant neoplasm of breast: Secondary | ICD-10-CM

## 2021-01-20 ENCOUNTER — Other Ambulatory Visit: Payer: Self-pay | Admitting: Family Medicine

## 2021-02-12 ENCOUNTER — Telehealth: Payer: Self-pay | Admitting: Orthopedic Surgery

## 2021-02-12 NOTE — Telephone Encounter (Signed)
Received call from patient requesting copy of 2016 records [facility]). After advising of needed auth form, I emailed form to pt to complete & sign, per her request [email or web address]. [phone]

## 2021-03-03 ENCOUNTER — Telehealth: Payer: Self-pay | Admitting: Orthopedic Surgery

## 2021-03-03 NOTE — Telephone Encounter (Signed)
Received vm from pt 12/30 checking on request for records. IC,lmvm [phone] advising we do not her authorization, asked for her to re-send either fax or email to me. Will process once received.

## 2021-03-18 ENCOUNTER — Other Ambulatory Visit: Payer: Self-pay | Admitting: Family Medicine

## 2021-03-18 DIAGNOSIS — M810 Age-related osteoporosis without current pathological fracture: Secondary | ICD-10-CM

## 2021-03-20 ENCOUNTER — Other Ambulatory Visit: Payer: Managed Care, Other (non HMO)

## 2021-04-09 ENCOUNTER — Other Ambulatory Visit: Payer: Managed Care, Other (non HMO)

## 2021-06-22 ENCOUNTER — Ambulatory Visit (INDEPENDENT_AMBULATORY_CARE_PROVIDER_SITE_OTHER): Payer: Managed Care, Other (non HMO) | Admitting: Family Medicine

## 2021-06-22 ENCOUNTER — Encounter: Payer: Self-pay | Admitting: Family Medicine

## 2021-06-22 VITALS — BP 122/82 | HR 69 | Temp 97.2°F | Resp 16 | Ht 61.0 in | Wt 167.4 lb

## 2021-06-22 DIAGNOSIS — Z0000 Encounter for general adult medical examination without abnormal findings: Secondary | ICD-10-CM

## 2021-06-22 DIAGNOSIS — J42 Unspecified chronic bronchitis: Secondary | ICD-10-CM | POA: Diagnosis not present

## 2021-06-22 DIAGNOSIS — M810 Age-related osteoporosis without current pathological fracture: Secondary | ICD-10-CM | POA: Diagnosis not present

## 2021-06-22 DIAGNOSIS — E669 Obesity, unspecified: Secondary | ICD-10-CM

## 2021-06-22 LAB — CBC WITH DIFFERENTIAL/PLATELET
Basophils Absolute: 0.1 10*3/uL (ref 0.0–0.1)
Basophils Relative: 1 % (ref 0.0–3.0)
Eosinophils Absolute: 0 10*3/uL (ref 0.0–0.7)
Eosinophils Relative: 0.8 % (ref 0.0–5.0)
HCT: 36 % (ref 36.0–46.0)
Hemoglobin: 12.3 g/dL (ref 12.0–15.0)
Lymphocytes Relative: 50.6 % — ABNORMAL HIGH (ref 12.0–46.0)
Lymphs Abs: 3.2 10*3/uL (ref 0.7–4.0)
MCHC: 34.1 g/dL (ref 30.0–36.0)
MCV: 91.5 fl (ref 78.0–100.0)
Monocytes Absolute: 0.4 10*3/uL (ref 0.1–1.0)
Monocytes Relative: 6.6 % (ref 3.0–12.0)
Neutro Abs: 2.6 10*3/uL (ref 1.4–7.7)
Neutrophils Relative %: 41 % — ABNORMAL LOW (ref 43.0–77.0)
Platelets: 194 10*3/uL (ref 150.0–400.0)
RBC: 3.94 Mil/uL (ref 3.87–5.11)
RDW: 13.7 % (ref 11.5–15.5)
WBC: 6.3 10*3/uL (ref 4.0–10.5)

## 2021-06-22 LAB — BASIC METABOLIC PANEL
BUN: 26 mg/dL — ABNORMAL HIGH (ref 6–23)
CO2: 25 mEq/L (ref 19–32)
Calcium: 9.2 mg/dL (ref 8.4–10.5)
Chloride: 102 mEq/L (ref 96–112)
Creatinine, Ser: 0.73 mg/dL (ref 0.40–1.20)
GFR: 90.02 mL/min (ref >60.00)
Glucose, Bld: 88 mg/dL (ref 70–99)
Potassium: 4.4 mEq/L (ref 3.5–5.1)
Sodium: 137 mEq/L (ref 135–145)

## 2021-06-22 LAB — HEPATIC FUNCTION PANEL
ALT: 12 U/L (ref 0–35)
AST: 22 U/L (ref 0–37)
Albumin: 4.5 g/dL (ref 3.5–5.2)
Alkaline Phosphatase: 46 U/L (ref 39–117)
Bilirubin, Direct: 0.1 mg/dL (ref 0.0–0.3)
Total Bilirubin: 0.4 mg/dL (ref 0.2–1.2)
Total Protein: 7.1 g/dL (ref 6.0–8.3)

## 2021-06-22 LAB — LIPID PANEL
Cholesterol: 281 mg/dL — ABNORMAL HIGH (ref 0–200)
HDL: 140.1 mg/dL (ref >39.00)
LDL Cholesterol: 114 mg/dL — ABNORMAL HIGH (ref 0–99)
NonHDL: 140.9
Total CHOL/HDL Ratio: 2
Triglycerides: 133 mg/dL (ref 0.0–149.0)
VLDL: 26.6 mg/dL (ref 0.0–40.0)

## 2021-06-22 LAB — TSH: TSH: 3.14 u[IU]/mL (ref 0.35–5.50)

## 2021-06-22 LAB — VITAMIN D 25 HYDROXY (VIT D DEFICIENCY, FRACTURES): VITD: 47.79 ng/mL (ref 30.00–100.00)

## 2021-06-22 NOTE — Progress Notes (Signed)
? ?  Subjective:  ? ? Patient ID: Rachel Fitzpatrick, female    DOB: December 09, 1960, 61 y.o.   MRN: [id number] ? ?HPI ?CPE- UTD on pap, mammo, colonoscopy, Tdap, flu ? ?Patient Care Team  ?  Relationship Specialty Notifications Start End  ?Midge Minium, MD PCP - General Family Medicine  05/08/14   ?Molli Posey, MD Consulting Physician Obstetrics and Gynecology  06/10/14   ?  ?Health Maintenance  ?Topic Date Due  ? INFLUENZA VACCINE  09/29/2021  ? MAMMOGRAM  01/01/2022  ? PAP SMEAR-Modifier  08/30/2022  ? COLONOSCOPY (Pts 45-24yr Insurance coverage will need to be confirmed)  07/31/2026  ? TETANUS/TDAP  06/21/2030  ? Hepatitis C Screening  Completed  ? HIV Screening  Completed  ? Zoster Vaccines- Shingrix  Completed  ? HPV VACCINES  Aged Out  ? COVID-19 Vaccine  Discontinued  ?  ? ? ?Review of Systems ?Patient reports no vision/ hearing changes, adenopathy,fever, weight change,  persistant/recurrent hoarseness , swallowing issues, chest pain, palpitations, edema, hemoptysis, dyspnea (rest/exertional/paroxysmal nocturnal), gastrointestinal bleeding (melena, rectal bleeding), abdominal pain, significant heartburn, bowel changes, GU symptoms (dysuria, hematuria, incontinence), Gyn symptoms (abnormal  bleeding, pain),  syncope, focal weakness, memory loss, numbness & tingling, skin/hair/nail changes, abnormal bruising or bleeding, anxiety, or depression.  ? ?+ ongoing cough.  Seems to recur seasonally.  Has been on multiple abx and is finishing course of Prednisone.  Has inhalers but minimal relief. ?   ?Objective:  ? Physical Exam ?General Appearance:    Alert, cooperative, no distress, appears stated age  ?Head:    Normocephalic, without obvious abnormality, atraumatic  ?Eyes:    PERRL, conjunctiva/corneas clear, EOM's intact, both eyes  ?Ears:    Normal TM's and external ear canals, both ears  ?Nose:   Nares normal, septum midline, mucosa normal, no drainage  ?  or sinus tenderness  ?Throat:   Lips, mucosa, and  tongue normal; teeth and gums normal  ?Neck:   Supple, symmetrical, trachea midline, no adenopathy;  ?  Thyroid: no enlargement/tenderness/nodules  ?Back:     Symmetric, no curvature, ROM normal, no CVA tenderness  ?Lungs:     Clear to auscultation bilaterally, respirations unlabored  ?Chest Wall:    No tenderness or deformity  ? Heart:    Regular rate and rhythm, S1 and S2 normal, no murmur, rub ?  or gallop  ?Breast Exam:    Deferred to GYN  ?Abdomen:     Soft, non-tender, bowel sounds active all four quadrants,  ?  no masses, no organomegaly  ?Genitalia:    Deferred to GYN  ?Rectal:    ?Extremities:   Extremities normal, atraumatic, no cyanosis or edema  ?Pulses:   2+ and symmetric all extremities  ?Skin:   Skin color, texture, turgor normal, no rashes or lesions  ?Lymph nodes:   Cervical, supraclavicular, and axillary nodes normal  ?Neurologic:   CNII-XII intact, normal strength, sensation and reflexes  ?  throughout  ?  ? ? ? ?   ?Assessment & Plan:  ? ? ?

## 2021-06-22 NOTE — Assessment & Plan Note (Signed)
Ongoing issue for pt.  BMI is currently 31.63.  Discussed need for healthy diet and regular exercise.  Check labs to risk stratify.  Will follow. ?

## 2021-06-22 NOTE — Assessment & Plan Note (Signed)
Check Vit D level and replete prn. 

## 2021-06-22 NOTE — Assessment & Plan Note (Signed)
Pt's PE WNL w/ exception of obesity.  UTD on pap, mammo, colonoscopy, Tdap, flu.  Check labs.  Anticipatory guidance provided.  ?

## 2021-06-22 NOTE — Patient Instructions (Signed)
Follow up in 6 months to recheck cholesterol ?We'll notify you of your lab results and make any changes if needed ?Continue to work on healthy diet and regular exercise- you can do it!!! ?We'll call you with your Pulmonary appt for evaluation ?Call with any questions or concerns ?Stay Safe!  Stay Healthy! ?Happy Spring!!! ?

## 2021-06-29 ENCOUNTER — Ambulatory Visit (INDEPENDENT_AMBULATORY_CARE_PROVIDER_SITE_OTHER): Payer: Managed Care, Other (non HMO) | Admitting: Family Medicine

## 2021-06-29 ENCOUNTER — Encounter: Payer: Self-pay | Admitting: Family Medicine

## 2021-06-29 VITALS — BP 116/68 | HR 105 | Temp 97.5°F | Resp 16 | Ht 61.0 in | Wt 169.4 lb

## 2021-06-29 DIAGNOSIS — L03314 Cellulitis of groin: Secondary | ICD-10-CM

## 2021-06-29 MED ORDER — ONDANSETRON HCL 4 MG PO TABS: 4.0000 mg | ORAL_TABLET | Freq: Three times a day (TID) | ORAL | 0 refills | Status: DC | PRN

## 2021-06-29 MED ORDER — DOXYCYCLINE HYCLATE 100 MG PO TABS: 100.0000 mg | ORAL_TABLET | Freq: Two times a day (BID) | ORAL | 0 refills | Status: DC

## 2021-06-29 NOTE — Progress Notes (Signed)
? ?  Subjective:  ? ? Patient ID: Rachel Fitzpatrick, female    DOB: 15-Oct-1960, 61 y.o.   MRN: [id number] ? ?HPI ?Boil- 'i'm in pain'.  Sxs started Friday.  Applied hot compresses all weekend.  Yesterday developed nausea and HA.  'i'm in pain'.  Area is red and hot to touch.  No drainage.  No fevers.  L groin crease. ? ? ?Review of Systems ?For ROS see HPI  ?   ?Objective:  ? Physical Exam ?Vitals reviewed.  ?Constitutional:   ?   General: She is not in acute distress. ?   Appearance: Normal appearance. She is not ill-appearing.  ?HENT:  ?   Head: Normocephalic and atraumatic.  ?Skin: ?   General: Skin is warm and dry.  ?   Findings: Erythema (pt has 4"x 1.5" central indurated area that is nearly purple w/ surrounding 1.5" area of erythema.  Very TTP.  no fluctuance or area to drain) present.  ?Neurological:  ?   General: No focal deficit present.  ?   Mental Status: She is alert and oriented to person, place, and time.  ?Psychiatric:     ?   Mood and Affect: Mood normal.     ?   Behavior: Behavior normal.     ?   Thought Content: Thought content normal.  ? ? ? ? ? ?   ?Assessment & Plan:  ?Cellulitis of L groin- new.  Pt's area is very red- almost purple- and very painful to touch.  Unfortunately there is not an area to incise or drain.  Will start Doxy twice daily- take w/ food.  Zofran as needed for nausea.  Tylenol/ibuprofen as needed for pain.  Pt to let me know if things worsen, otherwise will follow up on Friday or Monday to reassess.  Pt expressed understanding and is in agreement w/ plan.  ? ?

## 2021-06-29 NOTE — Patient Instructions (Signed)
Follow up on Friday or Monday to recheck infection ?START the Doxycycline twice daily- take w/ food ?USE the Ondansetron (Zofran) as needed for nausea ?Continue hot compresses but do not pick or squeeze ?Call with any questions or concerns ?Hang in there!!! ?

## 2021-07-01 ENCOUNTER — Ambulatory Visit (INDEPENDENT_AMBULATORY_CARE_PROVIDER_SITE_OTHER): Payer: Managed Care, Other (non HMO) | Admitting: Registered Nurse

## 2021-07-01 ENCOUNTER — Telehealth: Payer: Self-pay | Admitting: Family Medicine

## 2021-07-01 ENCOUNTER — Encounter: Payer: Self-pay | Admitting: Registered Nurse

## 2021-07-01 ENCOUNTER — Other Ambulatory Visit (HOSPITAL_COMMUNITY)
Admission: RE | Admit: 2021-07-01 | Discharge: 2021-07-01 | Disposition: A | Discharge status: Home / Self Care | Payer: Managed Care, Other (non HMO) | Source: Ambulatory Visit | Attending: Registered Nurse | Admitting: Registered Nurse

## 2021-07-01 VITALS — HR 80 | Temp 98.1°F | Resp 18 | Ht 61.0 in | Wt 172.2 lb

## 2021-07-01 DIAGNOSIS — L03314 Cellulitis of groin: Secondary | ICD-10-CM | POA: Diagnosis not present

## 2021-07-01 DIAGNOSIS — R8290 Unspecified abnormal findings in urine: Secondary | ICD-10-CM | POA: Diagnosis not present

## 2021-07-01 LAB — POCT URINALYSIS DIPSTICK
Bilirubin, UA: NEGATIVE
Blood, UA: POSITIVE
Glucose, UA: NEGATIVE
Ketones, UA: NEGATIVE
Nitrite, UA: NEGATIVE
Protein, UA: NEGATIVE
Spec Grav, UA: >=1.03 — AB (ref 1.010–1.025)
Urobilinogen, UA: 0.2 E.U./dL
pH, UA: 6 (ref 5.0–8.0)

## 2021-07-01 MED ORDER — SULFAMETHOXAZOLE-TRIMETHOPRIM 800-160 MG PO TABS: 1.0000 | ORAL_TABLET | Freq: Two times a day (BID) | ORAL | 0 refills | Status: DC

## 2021-07-01 NOTE — Telephone Encounter (Signed)
Please schedule pt to be seen w/ Rich this afternoon as it sounds like it needs to be re-evaluated ?

## 2021-07-01 NOTE — Telephone Encounter (Signed)
Seen for cellulitis 06/29/21 pt notes not as warm to touch but redness is spreading  ?Please advise  ?

## 2021-07-01 NOTE — Telephone Encounter (Signed)
Pt called in stating that the redness is spreading down the leg, she states that it feels better to the touch but is concerned about the redness  ? ?Please advise  ?

## 2021-07-01 NOTE — Progress Notes (Signed)
? ?Acute Office Visit ? ?Subjective:  ? ? Patient ID: Rachel Fitzpatrick, female    DOB: 05-14-1960, 61 y.o.   MRN: [id number] ? ?Chief Complaint  ?Patient presents with  ? Follow-up  ?  Patient states she is here for a follow up on a boil on her leg states that the redness has since went down her left leg  ? ? ?HPI ?Patient is in today for follow up  ? ?Boil/cellulitis in groin. L crease.  ?Onset 4/28, worsened over weekend despite hot compresses. ?Has had some nausea without vomiting. ? ?Seen by PCP Dr. Birdie Riddle on Monday 5/1. Unfortunately no drainable abscess ?Started on doxycycline '100mg'$  po bid and zofran for nausea ? ?Unfortunately worsened since. Redness spreading further down leg.  ?Crease of groin remains exquisitely tender with purplish pigmentation ?Of note there is a new area that looks like it may come to a head but has yet to produce any drainage. ?No new joint pain. Ongoing nausea, one episode of vomiting. No fevers ? ?She does have question or malodorous urine without further urinary symptoms. ? ?Otherwise no acute concerns.  ?Outpatient Medications Prior to Visit  ?Medication Sig Dispense Refill  ? alendronate (FOSAMAX) 70 MG tablet TAKE 1 TABLET BY MOUTH EVERY 7 DAYS. TAKE WITH A FULL GLASS OF WATER ON AN EMPTY STOMACH. 12 tablet 7  ? Calcium Citrate-Vitamin D (CALCIUM + D PO) Take 1 tablet by mouth 2 (two) times daily.    ? cetirizine (ZYRTEC) 10 MG tablet Take 1 tablet (10 mg total) by mouth daily. (Patient taking differently: Take 10 mg by mouth daily as needed for allergies.) 30 tablet 11  ? cholecalciferol (VITAMIN D) 1000 UNITS tablet Take 1,000 Units by mouth daily.    ? doxycycline (VIBRA-TABS) 100 MG tablet Take 1 tablet (100 mg total) by mouth 2 (two) times daily. 20 tablet 0  ? ferrous sulfate 325 (65 FE) MG tablet TAKE 1 TABLET BY MOUTH EVERY DAY WITH BREAKFAST 90 tablet 2  ? montelukast (SINGULAIR) 10 MG tablet Take 10 mg by mouth at bedtime.    ? omeprazole (PRILOSEC) 20 MG capsule Take 20  mg by mouth daily as needed (heartburn).    ? ondansetron (ZOFRAN) 4 MG tablet Take 1 tablet (4 mg total) by mouth every 8 (eight) hours as needed for nausea or vomiting. 30 tablet 0  ? rosuvastatin (CRESTOR) 20 MG tablet TAKE 1 TABLET BY MOUTH ONCE A DAY FOR CHOLESTEROL 90 tablet 1  ? sertraline (ZOLOFT) 100 MG tablet TAKE 2 TABLETS BY MOUTH EVERY DAY 180 tablet 1  ? ?No facility-administered medications prior to visit.  ? ? ?Review of Systems  ?Constitutional: Negative.   ?HENT: Negative.    ?Eyes: Negative.   ?Respiratory: Negative.    ?Cardiovascular: Negative.   ?Gastrointestinal: Negative.   ?Genitourinary: Negative.   ?Musculoskeletal: Negative.   ?Skin: Negative.   ?Neurological: Negative.   ?Psychiatric/Behavioral: Negative.    ?All other systems reviewed and are negative. ? ?   ?Objective:  ?  ?Pulse 80   Temp 98.1 ?F (36.7 ?C) (Temporal)   Resp 18   Ht '5\' 1"'$  (1.549 m)   Wt 172 lb 3.2 oz (78.1 kg)   SpO2 100%   BMI 32.54 kg/m?  ?Physical Exam ?Vitals and nursing note reviewed.  ?Constitutional:   ?   General: She is not in acute distress. ?   Appearance: Normal appearance. She is normal weight. She is not ill-appearing, toxic-appearing or diaphoretic.  ?Cardiovascular:  ?  Rate and Rhythm: Normal rate and regular rhythm.  ?   Heart sounds: Normal heart sounds. No murmur heard. ?  No friction rub. No gallop.  ?Pulmonary:  ?   Effort: Pulmonary effort is normal. No respiratory distress.  ?   Breath sounds: Normal breath sounds. No stridor. No wheezing, rhonchi or rales.  ?Chest:  ?   Chest wall: No tenderness.  ?Musculoskeletal:     ?   General: No swelling or tenderness. Normal range of motion.  ?Skin: ?   General: Skin is warm and dry.  ?   Capillary Refill: Capillary refill takes less than 2 seconds.  ?   Findings: Erythema (L groin) present.  ?Neurological:  ?   General: No focal deficit present.  ?   Mental Status: She is alert and oriented to person, place, and time. Mental status is at  baseline.  ?Psychiatric:     ?   Mood and Affect: Mood normal.     ?   Behavior: Behavior normal.     ?   Thought Content: Thought content normal.     ?   Judgment: Judgment normal.  ? ? ?No results found for any visits on 07/01/21. ? ? ?   ?Assessment & Plan:  ?1. Malodorous urine ?- POCT Urinalysis Dipstick ?- Urine cytology ancillary only([place]) ?- Urine Culture ? ?2. Cellulitis of left groin ?- sulfamethoxazole-trimethoprim (BACTRIM DS) 800-160 MG tablet; Take 1 tablet by mouth 2 (two) times daily.  Dispense: 14 tablet; Refill: 0 ? ? ? ?Meds ordered this encounter  ?Medications  ? sulfamethoxazole-trimethoprim (BACTRIM DS) 800-160 MG tablet  ?  Sig: Take 1 tablet by mouth 2 (two) times daily.  ?  Dispense:  14 tablet  ?  Refill:  0  ?  Order Specific Question:   Supervising Provider  ?  Answer:   Carlota Raspberry, JEFFREY R [[id number]]  ? ? ?Return if symptoms worsen or fail to improve, for as scheduled. ? ?PLAN ?Will add bactrim given spreading erythema. Low threshold for ER follow up. May need surgical debridement if not responding promptly to bactrim. ?UA does not show obvious UTI. Will send culture and adjust as warranted, though bactrim should cover most pathogens  ?Patient encouraged to call clinic with any questions, comments, or concerns. ? ? ?Maximiano Coss, NP ?

## 2021-07-01 NOTE — Patient Instructions (Signed)
Ms. Olmo -  ? ?Great to meet you ? ?Add bactrim twice daily to the routine. ? ?Keep your appt with Dr. Birdie Riddle on Friday. ? ?Call sooner if fevers, aches, nausea and vomiting worsen, or other red flags pop up ? ?Thank you ? ?Rich  ?

## 2021-07-01 NOTE — Telephone Encounter (Signed)
Pt notified and scheduled with Richard 1:50 pm today  ?

## 2021-07-02 LAB — URINE CYTOLOGY ANCILLARY ONLY
Bacterial Vaginitis-Urine: NEGATIVE
Candida Urine: NEGATIVE

## 2021-07-02 LAB — URINE CULTURE
MICRO NUMBER:: 13347547
Result:: NO GROWTH
SPECIMEN QUALITY:: ADEQUATE

## 2021-07-03 ENCOUNTER — Ambulatory Visit: Payer: Managed Care, Other (non HMO) | Admitting: Family Medicine

## 2021-07-07 ENCOUNTER — Encounter: Payer: Self-pay | Admitting: Family Medicine

## 2021-07-15 ENCOUNTER — Ambulatory Visit (INDEPENDENT_AMBULATORY_CARE_PROVIDER_SITE_OTHER): Payer: Managed Care, Other (non HMO) | Admitting: Pulmonary Disease

## 2021-07-15 ENCOUNTER — Encounter: Payer: Self-pay | Admitting: Pulmonary Disease

## 2021-07-15 VITALS — BP 120/64 | HR 71 | Ht 61.0 in | Wt 170.2 lb

## 2021-07-15 DIAGNOSIS — K219 Gastro-esophageal reflux disease without esophagitis: Secondary | ICD-10-CM

## 2021-07-15 DIAGNOSIS — K449 Diaphragmatic hernia without obstruction or gangrene: Secondary | ICD-10-CM | POA: Diagnosis not present

## 2021-07-15 DIAGNOSIS — J4520 Mild intermittent asthma, uncomplicated: Secondary | ICD-10-CM | POA: Diagnosis not present

## 2021-07-15 NOTE — Progress Notes (Signed)
? ?Synopsis: Referred in May 2025 for Chronic Bronchitis by Annye Asa, MD ? ?Subjective:  ? ?PATIENT ID: Gerrit Heck GENDER: female DOB: February 22, 1961, MRN: [id number] ? ? ?HPI ? ?Chief Complaint  ?Patient presents with  ? Consult  ?  Referred by PCP for chronic bronchitis. States she developed chronic bronchitis back in 2020 after having COVID.   ? ?Ruhee Enck is a 61 year old female, never smoker with history of GERD who is referred to pulmonary clinic for chronic bronchitis.  ? ?She reports having issues with cough since having covid 19 infection in 2020. She has a dry cough and is constantly clearing her throat throughout the day. She has increased cough, shortness of breath and wheezing when she has a cold and now she has these symptoms during allergy season, especially in the spring. She is taking zyrtec, singulair and flonase which help. She was prescribed advair and only tried it shortly because it made her feel funny. She does report issues with GERD. She does tend to eat late dinners. She has hiatal hernia noted on chest x-ray. ? ?She is a never smoker. She grew up with second hand smoke exposure. She currently works from home. She has gained a significant amount of weight over recent years. She has 2 dogs at home. She enjoys gardening.  ? ?Her mother had lung cancer as well as her maternal uncles. Her brother has lung cancer.  ? ?Past Medical History:  ?Diagnosis Date  ? Adenomatous colon polyp   ? Allergy   ? Anxiety   ? Arthritis   ? Blood in stool   ? Complication of anesthesia   ? Reports hypotension with anesthesia  ? COVID-19 03/2019  ? GERD (gastroesophageal reflux disease)   ? Hypercholesteremia   ? Osteopenia   ?  ? ?Family History  ?Problem Relation Age of Onset  ? Hyperlipidemia Mother   ? Cancer Mother   ?     lung cancer  ? Hyperlipidemia Father   ? Diabetes Father   ? COPD Father   ? Sudden death Neg Hx   ? Heart attack Neg Hx   ? Hypertension Neg Hx   ? Colon cancer Neg Hx   ?  Esophageal cancer Neg Hx   ? Rectal cancer Neg Hx   ? Stomach cancer Neg Hx   ?  ? ?Social History  ? ?Socioeconomic History  ? Marital status: Married  ?  Spouse name: Not on file  ? Number of children: Not on file  ? Years of education: Not on file  ? Highest education level: Not on file  ?Occupational History  ? Not on file  ?Tobacco Use  ? Smoking status: Never  ?  Passive exposure: Past  ? Smokeless tobacco: Never  ?Vaping Use  ? Vaping Use: Never used  ?Substance and Sexual Activity  ? Alcohol use: Yes  ?  Comment: Weekends; 2-3 beverages; Social.  ? Drug use: No  ? Sexual activity: Not on file  ?Other Topics Concern  ? Not on file  ?Social History Narrative  ? Divorced she has 2 daughters both are nurses [place] in [place]  ? 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 One caffeinated beverage daily  ? ?Social Determinants of Health  ? ?Financial Resource Strain: Not on file  ?Food Insecurity: Not on file  ?Transportation Needs: Not on file  ?Physical Activity: Not on file  ?Stress: Not on file  ?Social Connections: Not  on file  ?Intimate Partner Violence: Not on file  ?  ? ?No Known Allergies ?  ? ?Outpatient Medications Prior to Visit  ?Medication Sig Dispense Refill  ? alendronate (FOSAMAX) 70 MG tablet TAKE 1 TABLET BY MOUTH EVERY 7 DAYS. TAKE WITH A FULL GLASS OF WATER ON AN EMPTY STOMACH. 12 tablet 7  ? Calcium Citrate-Vitamin D (CALCIUM + D PO) Take 1 tablet by mouth 2 (two) times daily.    ? cetirizine (ZYRTEC) 10 MG tablet Take 1 tablet (10 mg total) by mouth daily. (Patient taking differently: Take 10 mg by mouth daily as needed for allergies.) 30 tablet 11  ? cholecalciferol (VITAMIN D) 1000 UNITS tablet Take 1,000 Units by mouth daily.    ? ferrous sulfate 325 (65 FE) MG tablet TAKE 1 TABLET BY MOUTH EVERY DAY WITH BREAKFAST 90 tablet 2  ? montelukast (SINGULAIR) 10 MG tablet Take 10 mg by mouth at bedtime.    ? omeprazole (PRILOSEC) 20 MG capsule Take 20 mg by mouth daily as needed  (heartburn).    ? ondansetron (ZOFRAN) 4 MG tablet Take 1 tablet (4 mg total) by mouth every 8 (eight) hours as needed for nausea or vomiting. 30 tablet 0  ? rosuvastatin (CRESTOR) 20 MG tablet TAKE 1 TABLET BY MOUTH ONCE A DAY FOR CHOLESTEROL 90 tablet 1  ? sertraline (ZOLOFT) 100 MG tablet TAKE 2 TABLETS BY MOUTH EVERY DAY 180 tablet 1  ? doxycycline (VIBRA-TABS) 100 MG tablet Take 1 tablet (100 mg total) by mouth 2 (two) times daily. 20 tablet 0  ? sulfamethoxazole-trimethoprim (BACTRIM DS) 800-160 MG tablet Take 1 tablet by mouth 2 (two) times daily. 14 tablet 0  ? ?No facility-administered medications prior to visit.  ? ?Review of Systems  ?Constitutional:  Negative for chills, fever, malaise/fatigue and weight loss.  ?HENT:  Positive for congestion. Negative for sinus pain and sore throat.   ?Eyes: Negative.   ?Respiratory:  Positive for cough, shortness of breath and wheezing. Negative for hemoptysis and sputum production.   ?Cardiovascular:  Negative for chest pain, palpitations, orthopnea, claudication and leg swelling.  ?Gastrointestinal:  Positive for heartburn. Negative for abdominal pain, nausea and vomiting.  ?Genitourinary: Negative.   ?Musculoskeletal:  Negative for joint pain and myalgias.  ?Skin:  Negative for rash.  ?Neurological:  Negative for weakness.  ?Endo/Heme/Allergies: Negative.   ?Psychiatric/Behavioral: Negative.    ? ?Objective:  ? ?Vitals:  ? 07/15/21 0929  ?BP: 120/64  ?Pulse: 71  ?SpO2: 100%  ?Weight: 170 lb 3.2 oz (77.2 kg)  ?Height: '5\' 1"'$  (1.549 m)  ? ?Physical Exam ?Constitutional:   ?   General: She is not in acute distress. ?   Appearance: She is not ill-appearing.  ?HENT:  ?   Head: Normocephalic and atraumatic.  ?Eyes:  ?   General: No scleral icterus. ?   Conjunctiva/sclera: Conjunctivae normal.  ?   Pupils: Pupils are equal, round, and reactive to light.  ?Cardiovascular:  ?   Rate and Rhythm: Normal rate and regular rhythm.  ?   Pulses: Normal pulses.  ?   Heart sounds:  Normal heart sounds. No murmur heard. ?Pulmonary:  ?   Effort: Pulmonary effort is normal.  ?   Breath sounds: Normal breath sounds. No wheezing, rhonchi or rales.  ?Abdominal:  ?   General: Bowel sounds are normal.  ?   Palpations: Abdomen is soft.  ?Musculoskeletal:  ?   Right lower leg: No edema.  ?   Left lower  leg: No edema.  ?Lymphadenopathy:  ?   Cervical: No cervical adenopathy.  ?Skin: ?   General: Skin is warm and dry.  ?Neurological:  ?   General: No focal deficit present.  ?   Mental Status: She is alert.  ?Psychiatric:     ?   Mood and Affect: Mood normal.     ?   Behavior: Behavior normal.     ?   Thought Content: Thought content normal.     ?   Judgment: Judgment normal.  ? ?CBC ?   ?Component Value Date/Time  ? WBC 6.3 06/22/2021 0833  ? RBC 3.94 06/22/2021 0833  ? HGB 12.3 06/22/2021 0833  ? HCT 36.0 06/22/2021 0833  ? PLT 194.0 06/22/2021 0833  ? MCV 91.5 06/22/2021 0833  ? MCH 31.0 06/06/2017 0808  ? MCHC 34.1 06/22/2021 0833  ? RDW 13.7 06/22/2021 0833  ? LYMPHSABS 3.2 06/22/2021 0833  ? MONOABS 0.4 06/22/2021 0833  ? EOSABS 0.0 06/22/2021 0833  ? BASOSABS 0.1 06/22/2021 5498  ? ? ?  Latest Ref Rng & Units 06/22/2021  ?  8:33 AM 12/19/2020  ?  9:46 AM 06/20/2020  ?  8:10 AM  ?BMP  ?Glucose 70 - 99 mg/dL 88   102   98    ?BUN 6 - 23 mg/dL '26   24   25    '$ ?Creatinine 0.40 - 1.20 mg/dL 0.73   0.76   0.77    ?Sodium 135 - 145 mEq/L 137   138   140    ?Potassium 3.5 - 5.1 mEq/L 4.4   4.2   4.5    ?Chloride 96 - 112 mEq/L 102   103   105    ?CO2 19 - 32 mEq/L '25   26   23    '$ ?Calcium 8.4 - 10.5 mg/dL 9.2   9.5   9.2    ? ?Chest imaging: ?CXR 06/10/20 ?The lungs are adequately aerated without focal opacities.  No pleural abnormalities.  The cardiac silhouette is normal size. The visualized osseous structures are unremarkable.  Hiatal hernia. ? ?PFT: ?   ? View : No data to display.  ?  ?  ?  ? ? ?Labs: ? ?Path: ? ?Echo: ? ?Heart Catheterization: ? ?Assessment & Plan:  ? ?Mild intermittent reactive airway  disease without complication - Plan: Pulmonary Function Test ? ?Gastroesophageal reflux disease without esophagitis ? ?Hiatal hernia ? ?Discussion: ?Haivyn Oravec is a 61 year old female, never smoker with history of G

## 2021-07-15 NOTE — Patient Instructions (Signed)
You may have developed reactive airways disease after covid 19 infection.  ? ?Triggers to your reactive airways disease include seasonal allergies and GERD or reflux disease. ? ?Continue taking montelukast and zyrtec along with flonase for allergies ? ?Take omeprazole '20mg'$  daily 30 minutes before breakfast.  ? ?Recommend elevating the head of your bed at night to reduce nocturnal reflux or use a wedge pillow ? ?Recommend eating dinner/snacks at least 2 hours before bedtime. ? ?Follow up in 3 months for pulmonary function tests ?

## 2021-07-16 MED ORDER — WEGOVY 0.25 MG/0.5ML ~~LOC~~ SOAJ
0.2500 mg | SUBCUTANEOUS | 1 refills | Status: DC
Start: 2021-07-16 — End: 2021-10-27

## 2021-07-21 ENCOUNTER — Other Ambulatory Visit: Payer: Self-pay | Admitting: Family Medicine

## 2021-07-31 ENCOUNTER — Other Ambulatory Visit: Payer: Managed Care, Other (non HMO)

## 2021-09-12 ENCOUNTER — Other Ambulatory Visit: Payer: Self-pay | Admitting: Family Medicine

## 2021-09-16 ENCOUNTER — Ambulatory Visit
Admission: RE | Admit: 2021-09-16 | Discharge: 2021-09-16 | Disposition: A | Discharge status: Home / Self Care | Payer: Managed Care, Other (non HMO) | Source: Ambulatory Visit | Attending: Family Medicine | Admitting: Family Medicine

## 2021-09-16 DIAGNOSIS — M810 Age-related osteoporosis without current pathological fracture: Secondary | ICD-10-CM

## 2021-09-16 NOTE — Progress Notes (Signed)
Informed pt of bone density results

## 2021-09-21 ENCOUNTER — Other Ambulatory Visit (HOSPITAL_COMMUNITY): Payer: Self-pay | Admitting: Orthopedic Surgery

## 2021-10-12 ENCOUNTER — Other Ambulatory Visit (HOSPITAL_COMMUNITY): Payer: Self-pay | Admitting: Orthopedic Surgery

## 2021-10-27 ENCOUNTER — Encounter: Payer: Self-pay | Admitting: Pulmonary Disease

## 2021-10-27 ENCOUNTER — Ambulatory Visit (INDEPENDENT_AMBULATORY_CARE_PROVIDER_SITE_OTHER): Payer: Managed Care, Other (non HMO) | Admitting: Pulmonary Disease

## 2021-10-27 VITALS — BP 118/82 | HR 77 | Temp 98.3°F | Ht 61.0 in | Wt 171.0 lb

## 2021-10-27 DIAGNOSIS — J4520 Mild intermittent asthma, uncomplicated: Secondary | ICD-10-CM

## 2021-10-27 DIAGNOSIS — K219 Gastro-esophageal reflux disease without esophagitis: Secondary | ICD-10-CM | POA: Diagnosis not present

## 2021-10-27 DIAGNOSIS — K449 Diaphragmatic hernia without obstruction or gangrene: Secondary | ICD-10-CM

## 2021-10-27 LAB — PULMONARY FUNCTION TEST
DL/VA % pred: 112 %
DL/VA: 4.87 ml/min/mmHg/L
DLCO cor % pred: 104 %
DLCO cor: 19.15 ml/min/mmHg
DLCO unc % pred: 104 %
DLCO unc: 19.15 ml/min/mmHg
FEF 25-75 Post: 2.39 L/sec
FEF 25-75 Pre: 2.36 L/sec
FEF2575-%Change-Post: 1 %
FEF2575-%Pred-Post: 107 %
FEF2575-%Pred-Pre: 106 %
FEV1-%Change-Post: 0 %
FEV1-%Pred-Post: 104 %
FEV1-%Pred-Pre: 104 %
FEV1-Post: 2.41 L
FEV1-Pre: 2.41 L
FEV1FVC-%Change-Post: 4 %
FEV1FVC-%Pred-Pre: 104 %
FEV6-%Change-Post: -4 %
FEV6-%Pred-Post: 98 %
FEV6-%Pred-Pre: 102 %
FEV6-Post: 2.82 L
FEV6-Pre: 2.94 L
FEV6FVC-%Change-Post: 0 %
FEV6FVC-%Pred-Post: 103 %
FEV6FVC-%Pred-Pre: 103 %
FVC-%Change-Post: -4 %
FVC-%Pred-Post: 94 %
FVC-%Pred-Pre: 98 %
FVC-Post: 2.82 L
FVC-Pre: 2.94 L
Post FEV1/FVC ratio: 86 %
Post FEV6/FVC ratio: 100 %
Pre FEV1/FVC ratio: 82 %
Pre FEV6/FVC Ratio: 100 %
RV % pred: 95 %
RV: 1.74 L
TLC % pred: 122 %
TLC: 5.64 L

## 2021-10-27 NOTE — Patient Instructions (Signed)
Full PFT performed today. °

## 2021-10-27 NOTE — Progress Notes (Signed)
Full PFT performed today. °

## 2021-10-27 NOTE — Patient Instructions (Addendum)
We will request a CD of your chest x-ray from [facility] 06/10/20  Your breathing tests are within normal limits  Continue omperazole and elevating the head of the bed for reflux  Follow up as needed if your symptoms return with a viral infection

## 2021-10-27 NOTE — Progress Notes (Signed)
Synopsis: Referred in May 2025 for Chronic Bronchitis by Annye Asa, MD  Subjective:   PATIENT ID: Rachel Fitzpatrick GENDER: female DOB: [birth year], MRN: [id number]   HPI  Chief Complaint  Patient presents with   Follow-up    PFT's done today. Breathing is doing well and no new co's.    Rachel Fitzpatrick is a 61 year old female, never smoker with history of GERD who returns to pulmonary clinic for chronic bronchitis.   She reports feeling well since last visit. She denies cough or issues with her breathing. She continues on omprazole '20mg'$  daily and started elevating the head of her bed. She tries to eat 2 hours before bedtime as often as possible.  PFTs are within normal limits.   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   Initial OV 07/15/21 She reports having issues with cough since having covid 19 infection in 2020. She has a dry cough and is constantly clearing her throat throughout the day. She has increased cough, shortness of breath and wheezing when she has a cold and now she has these symptoms during allergy season, especially in the spring. She is taking zyrtec, singulair and flonase which help. She was prescribed advair and only tried it shortly because it made her feel funny. She does report issues with GERD. She does tend to eat late dinners. She has hiatal hernia noted on chest x-ray.  She is a never smoker. She grew up with second hand smoke exposure. She currently works from home. She has gained a significant amount of weight over recent years. She has 2 dogs at home. She enjoys gardening.   Her mother had lung cancer as well as her maternal uncles. Her brother has lung cancer.   Past Medical History:  Diagnosis Date   Adenomatous colon polyp    Allergy    Anxiety    Arthritis    Blood in stool    Complication of anesthesia    Reports hypotension with anesthesia   COVID-19 03/2019   GERD (gastroesophageal reflux  disease)    Hypercholesteremia    Osteopenia      Family History  Problem Relation Age of Onset   Hyperlipidemia Mother    Cancer Mother        lung cancer   Hyperlipidemia Father    Diabetes Father    COPD Father    Sudden death Neg Hx    Heart attack Neg Hx    Hypertension Neg Hx    Colon cancer Neg Hx    Esophageal cancer Neg Hx    Rectal cancer Neg Hx    Stomach cancer Neg Hx      Social History   Socioeconomic History   Marital status: Married    Spouse name: Not on file   Number of children: Not on file   Years of education: Not on file   Highest education level: Not on file  Occupational History   Not on file  Tobacco Use   Smoking status: Never    Passive exposure: Past   Smokeless tobacco: Never  Vaping Use   Vaping Use: Never used  Substance and Sexual Activity   Alcohol use: Yes    Comment: Weekends; 2-3 beverages; Social.   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 TAKE WITH A FULL GLASS OF WATER ON AN EMPTY STOMACH. 12 tablet 7   Calcium Citrate-Vitamin D (CALCIUM + D PO) Take 1 tablet by mouth 2 (two) times daily.     cetirizine (ZYRTEC) 10 MG tablet Take 1 tablet (10 mg total) by mouth daily. (Patient taking differently: Take 10 mg by mouth daily as needed for allergies.) 30 tablet 11    cholecalciferol (VITAMIN D) 1000 UNITS tablet Take 1,000 Units by mouth daily.     ferrous sulfate 325 (65 FE) MG tablet TAKE 1 TABLET BY MOUTH EVERY DAY WITH BREAKFAST 90 tablet 2   montelukast (SINGULAIR) 10 MG tablet Take 10 mg by mouth at bedtime.     omeprazole (PRILOSEC) 20 MG capsule Take 20 mg by mouth daily as needed (heartburn).     rosuvastatin (CRESTOR) 20 MG tablet TAKE 1 TABLET BY MOUTH ONCE A DAY FOR CHOLESTEROL 90 tablet 1   sertraline (ZOLOFT) 100 MG tablet TAKE 2 TABLETS BY MOUTH EVERY DAY 180 tablet 1   ondansetron (ZOFRAN) 4 MG tablet Take 1 tablet (4 mg total) by mouth every 8 (eight) hours as needed for nausea or vomiting. 30 tablet 0   Semaglutide-Weight Management (WEGOVY) 0.25 MG/0.5ML SOAJ Inject 0.25 mg into the skin once a week. 2 mL 1   No facility-administered medications prior to visit.   Review of Systems  Constitutional:  Negative for chills, fever, malaise/fatigue and weight loss.  HENT:  Negative for congestion, sinus pain and sore throat.   Eyes: Negative.   Respiratory:  Negative for cough, hemoptysis, sputum production, shortness of breath and wheezing.   Cardiovascular:  Negative for chest pain, palpitations, orthopnea, claudication and leg swelling.  Gastrointestinal:  Negative for abdominal pain, heartburn, nausea and vomiting.  Genitourinary: Negative.   Musculoskeletal:  Negative for joint pain and myalgias.  Skin:  Negative for rash.  Neurological:  Negative for weakness.  Endo/Heme/Allergies: Negative.   Psychiatric/Behavioral: Negative.      Objective:   Vitals:   10/27/21 1022  BP: 118/82  Pulse: 77  Temp: 98.3 F (36.8 C)  TempSrc: Oral  SpO2: 96%  Weight: 171 lb (77.6 kg)  Height: '5\' 1"'$  (1.549 m)   Physical Exam Constitutional:      General: She is not in acute distress.    Appearance: She is not ill-appearing.  HENT:     Head: Normocephalic and atraumatic.  Eyes:     General: No scleral icterus. Cardiovascular:     Rate  and Rhythm: Normal rate and regular rhythm.     Pulses: Normal pulses.     Heart sounds: Normal heart sounds. No murmur heard. Pulmonary:     Effort: Pulmonary effort is normal.     Breath sounds: Normal breath sounds. No wheezing, rhonchi or rales.  Musculoskeletal:     Right lower leg: No edema.  Left lower leg: No edema.  Skin:    General: Skin is warm and dry.  Neurological:     General: No focal deficit present.     Mental Status: She is alert.  Psychiatric:        Mood and Affect: Mood normal.        Behavior: Behavior normal.        Thought Content: Thought content normal.        Judgment: Judgment normal.    CBC    Component Value Date/Time   WBC 6.3 06/22/2021 0833   RBC 3.94 06/22/2021 0833   HGB 12.3 06/22/2021 0833   HCT 36.0 06/22/2021 0833   PLT 194.0 06/22/2021 0833   MCV 91.5 06/22/2021 0833   MCH 31.0 06/06/2017 0808   MCHC 34.1 06/22/2021 0833   RDW 13.7 06/22/2021 0833   LYMPHSABS 3.2 06/22/2021 0833   MONOABS 0.4 06/22/2021 0833   EOSABS 0.0 06/22/2021 0833   BASOSABS 0.1 06/22/2021 0833      Latest Ref Rng & Units 06/22/2021    8:33 AM 12/19/2020    9:46 AM 06/20/2020    8:10 AM  BMP  Glucose 70 - 99 mg/dL 88  102  98   BUN 6 - 23 mg/dL '26  24  25   '$ Creatinine 0.40 - 1.20 mg/dL 0.73  0.76  0.77   Sodium 135 - 145 mEq/L 137  138  140   Potassium 3.5 - 5.1 mEq/L 4.4  4.2  4.5   Chloride 96 - 112 mEq/L 102  103  105   CO2 19 - 32 mEq/L '25  26  23   '$ Calcium 8.4 - 10.5 mg/dL 9.2  9.5  9.2    Chest imaging: CXR 06/10/20 The lungs are adequately aerated without focal opacities.  No pleural abnormalities.  The cardiac silhouette is normal size. The visualized osseous structures are unremarkable.  Hiatal hernia.  PFT:    Latest Ref Rng & Units 10/27/2021    8:53 AM  PFT Results  FVC-Pre L 2.94  P  FVC-Predicted Pre % 98  P  FVC-Post L 2.82  P  FVC-Predicted Post % 94  P  Pre FEV1/FVC % % 82  P  Post FEV1/FCV % % 86  P  FEV1-Pre L 2.41  P   FEV1-Predicted Pre % 104  P  FEV1-Post L 2.41  P  DLCO uncorrected ml/min/mmHg 19.15  P  DLCO UNC% % 104  P  DLCO corrected ml/min/mmHg 19.15  P  DLCO COR %Predicted % 104  P  DLVA Predicted % 112  P  TLC L 5.64  P  TLC % Predicted % 122  P  RV % Predicted % 95  P    P Preliminary result    Labs:  Path:  Echo:  Heart Catheterization:  Assessment & Plan:   Mild intermittent reactive airway disease without complication  Gastroesophageal reflux disease without esophagitis  Hiatal hernia  Discussion: Rachel Fitzpatrick is a 61 year old female, never smoker with history of GERD who returns to pulmonary clinic for chronic bronchitis.   She appears to have developed reactive airways disease since having covid 19 infection in 2020. She has ongoing triggers which include her reflux disease with hiatal hernia and seasonal allergies.  Her symptoms have resolved since treating allergies and GERD. She also has not had any viral infections since last visit.   She is to continue montelukast, zyrtec and flonase for allergies.  She is  to continue omeprazole '20mg'$  daily. She is to continue elevating the head of your bed at night to reduce nocturnal reflux or use a wedge pillow. Recommend eating dinner/snacks at least 2 hours before bedtime.  Her breathing tests are normal. We will request CD of the [facility] Chest radiograph from last year for personal review.  Follow up as needed in the future if her symptoms are to return.  Freda Jackson, MD [facility] Office: [phone]   Current Outpatient Medications:    alendronate (FOSAMAX) 70 MG tablet, TAKE 1 TABLET BY MOUTH EVERY 7 DAYS. TAKE WITH A FULL GLASS OF WATER ON AN EMPTY STOMACH., Disp: 12 tablet, Rfl: 7   Calcium Citrate-Vitamin D (CALCIUM + D PO), Take 1 tablet by mouth 2 (two) times daily., Disp: , Rfl:    cetirizine (ZYRTEC) 10 MG tablet, Take 1 tablet (10 mg total) by mouth daily. (Patient taking  differently: Take 10 mg by mouth daily as needed for allergies.), Disp: 30 tablet, Rfl: 11   cholecalciferol (VITAMIN D) 1000 UNITS tablet, Take 1,000 Units by mouth daily., Disp: , Rfl:    ferrous sulfate 325 (65 FE) MG tablet, TAKE 1 TABLET BY MOUTH EVERY DAY WITH BREAKFAST, Disp: 90 tablet, Rfl: 2   montelukast (SINGULAIR) 10 MG tablet, Take 10 mg by mouth at bedtime., Disp: , Rfl:    omeprazole (PRILOSEC) 20 MG capsule, Take 20 mg by mouth daily as needed (heartburn)., Disp: , Rfl:    rosuvastatin (CRESTOR) 20 MG tablet, TAKE 1 TABLET BY MOUTH ONCE A DAY FOR CHOLESTEROL, Disp: 90 tablet, Rfl: 1   sertraline (ZOLOFT) 100 MG tablet, TAKE 2 TABLETS BY MOUTH EVERY DAY, Disp: 180 tablet, Rfl: 1

## 2021-10-28 ENCOUNTER — Encounter (HOSPITAL_BASED_OUTPATIENT_CLINIC_OR_DEPARTMENT_OTHER): Payer: Self-pay | Admitting: Orthopedic Surgery

## 2021-10-28 ENCOUNTER — Other Ambulatory Visit: Payer: Self-pay

## 2021-10-28 NOTE — Progress Notes (Signed)

## 2021-11-04 ENCOUNTER — Encounter (HOSPITAL_BASED_OUTPATIENT_CLINIC_OR_DEPARTMENT_OTHER): Payer: Self-pay | Admitting: Orthopedic Surgery

## 2021-11-04 NOTE — Anesthesia Preprocedure Evaluation (Addendum)
Anesthesia Evaluation  Patient identified by MRN, date of birth, ID band Patient awake    Reviewed: Allergy & Precautions, NPO status , Patient's Chart, lab work & pertinent test results  History of Anesthesia Complications (+) history of anesthetic complications  Airway Mallampati: I  TM Distance: >3 FB Neck ROM: Full    Dental no notable dental hx. (+) Teeth Intact, Dental Advisory Given   Pulmonary neg pulmonary ROS,    Pulmonary exam normal breath sounds clear to auscultation       Cardiovascular negative cardio ROS Normal cardiovascular exam Rhythm:Regular Rate:Normal     Neuro/Psych PSYCHIATRIC DISORDERS Anxiety Depression negative neurological ROS     GI/Hepatic Neg liver ROS, hiatal hernia, GERD  Medicated and Controlled,  Endo/Other  negative endocrine ROS  Renal/GU negative Renal ROS  negative genitourinary   Musculoskeletal  (+) Arthritis , Osteoarthritis,  Osteopenia   Abdominal (+) + obese,   Peds  Hematology  (+) Blood dyscrasia, anemia ,   Anesthesia Other Findings   Reproductive/Obstetrics                            Anesthesia Physical Anesthesia Plan  ASA: 2  Anesthesia Plan: General   Post-op Pain Management: Regional block* and Minimal or no pain anticipated   Induction: Intravenous  PONV Risk Score and Plan: 4 or greater and Treatment may vary due to age or medical condition, Ondansetron and Midazolam  Airway Management Planned: LMA  Additional Equipment: None  Intra-op Plan:   Post-operative Plan: Extubation in OR  Informed Consent: I have reviewed the patients History and Physical, chart, labs and discussed the procedure including the risks, benefits and alternatives for the proposed anesthesia with the patient or authorized representative who has indicated his/her understanding and acceptance.     Dental advisory given  Plan Discussed with: CRNA  and Anesthesiologist  Anesthesia Plan Comments:        Anesthesia Quick Evaluation

## 2021-11-05 ENCOUNTER — Ambulatory Visit (HOSPITAL_BASED_OUTPATIENT_CLINIC_OR_DEPARTMENT_OTHER): Payer: Managed Care, Other (non HMO) | Admitting: Certified Registered"

## 2021-11-05 ENCOUNTER — Other Ambulatory Visit: Payer: Self-pay

## 2021-11-05 ENCOUNTER — Ambulatory Visit (HOSPITAL_BASED_OUTPATIENT_CLINIC_OR_DEPARTMENT_OTHER): Payer: Managed Care, Other (non HMO)

## 2021-11-05 ENCOUNTER — Encounter (HOSPITAL_BASED_OUTPATIENT_CLINIC_OR_DEPARTMENT_OTHER): Payer: Self-pay | Admitting: Orthopedic Surgery

## 2021-11-05 ENCOUNTER — Encounter (HOSPITAL_BASED_OUTPATIENT_CLINIC_OR_DEPARTMENT_OTHER)
Admission: RE | Disposition: A | Discharge status: Home / Self Care | Payer: Self-pay | Source: Home / Self Care | Attending: Orthopedic Surgery

## 2021-11-05 ENCOUNTER — Ambulatory Visit (HOSPITAL_BASED_OUTPATIENT_CLINIC_OR_DEPARTMENT_OTHER)
Admission: RE | Admit: 2021-11-05 | Discharge: 2021-11-05 | Disposition: A | Discharge status: Home / Self Care | Payer: Managed Care, Other (non HMO) | Attending: Orthopedic Surgery | Admitting: Orthopedic Surgery

## 2021-11-05 DIAGNOSIS — M1990 Unspecified osteoarthritis, unspecified site: Secondary | ICD-10-CM | POA: Insufficient documentation

## 2021-11-05 DIAGNOSIS — T8484XA Pain due to internal orthopedic prosthetic devices, implants and grafts, initial encounter: Secondary | ICD-10-CM | POA: Diagnosis not present

## 2021-11-05 DIAGNOSIS — M2041 Other hammer toe(s) (acquired), right foot: Secondary | ICD-10-CM

## 2021-11-05 DIAGNOSIS — M2011 Hallux valgus (acquired), right foot: Secondary | ICD-10-CM | POA: Diagnosis present

## 2021-11-05 DIAGNOSIS — M7741 Metatarsalgia, right foot: Secondary | ICD-10-CM | POA: Insufficient documentation

## 2021-11-05 DIAGNOSIS — F418 Other specified anxiety disorders: Secondary | ICD-10-CM | POA: Diagnosis not present

## 2021-11-05 DIAGNOSIS — Z01818 Encounter for other preprocedural examination: Secondary | ICD-10-CM

## 2021-11-05 DIAGNOSIS — Y831 Surgical operation with implant of artificial internal device as the cause of abnormal reaction of the patient, or of later complication, without mention of misadventure at the time of the procedure: Secondary | ICD-10-CM | POA: Insufficient documentation

## 2021-11-05 DIAGNOSIS — M21611 Bunion of right foot: Secondary | ICD-10-CM

## 2021-11-05 DIAGNOSIS — M8580 Other specified disorders of bone density and structure, unspecified site: Secondary | ICD-10-CM | POA: Insufficient documentation

## 2021-11-05 DIAGNOSIS — Z79899 Other long term (current) drug therapy: Secondary | ICD-10-CM | POA: Diagnosis not present

## 2021-11-05 DIAGNOSIS — K219 Gastro-esophageal reflux disease without esophagitis: Secondary | ICD-10-CM | POA: Diagnosis not present

## 2021-11-05 HISTORY — PX: BUNIONECTOMY: SHX129

## 2021-11-05 HISTORY — PX: WEIL OSTEOTOMY: SHX5044

## 2021-11-05 HISTORY — DX: Personal history of other diseases of the digestive system: Z87.19

## 2021-11-05 HISTORY — PX: HARDWARE REMOVAL: SHX979

## 2021-11-05 HISTORY — DX: Unspecified chronic bronchitis: J42

## 2021-11-05 HISTORY — DX: Depression, unspecified: F32.A

## 2021-11-05 HISTORY — DX: Anemia, unspecified: D64.9

## 2021-11-05 SURGERY — BUNIONECTOMY
Anesthesia: General | Site: Toe | Laterality: Right

## 2021-11-05 MED ORDER — BACITRACIN ZINC 500 UNIT/GM EX OINT
TOPICAL_OINTMENT | CUTANEOUS | Status: AC
  Filled 2021-11-05: qty 0.9

## 2021-11-05 MED ORDER — OXYCODONE HCL 5 MG PO TABS
5.0000 mg | ORAL_TABLET | Freq: Once | ORAL | Status: AC | PRN
  Administered 2021-11-05: 5 mg via ORAL

## 2021-11-05 MED ORDER — SENNA 8.6 MG PO TABS: 2.0000 | ORAL_TABLET | Freq: Two times a day (BID) | ORAL | 0 refills | Status: DC

## 2021-11-05 MED ORDER — OXYCODONE HCL 5 MG PO TABS: 5.0000 mg | ORAL_TABLET | ORAL | 0 refills | Status: AC | PRN

## 2021-11-05 MED ORDER — 0.9 % SODIUM CHLORIDE (POUR BTL) OPTIME
TOPICAL | Status: DC | PRN
  Administered 2021-11-05: 200 mL

## 2021-11-05 MED ORDER — ROPIVACAINE HCL 7.5 MG/ML IJ SOLN
INTRAMUSCULAR | Status: DC | PRN
  Administered 2021-11-05: 20 mL via PERINEURAL

## 2021-11-05 MED ORDER — MIDAZOLAM HCL 2 MG/2ML IJ SOLN
1.0000 mg | Freq: Once | INTRAMUSCULAR | Status: AC
  Administered 2021-11-05: 1 mg via INTRAVENOUS

## 2021-11-05 MED ORDER — EPHEDRINE SULFATE (PRESSORS) 50 MG/ML IJ SOLN
INTRAMUSCULAR | Status: DC | PRN
  Administered 2021-11-05 (×2): 10 mg via INTRAVENOUS

## 2021-11-05 MED ORDER — LIDOCAINE HCL (CARDIAC) PF 100 MG/5ML IV SOSY
PREFILLED_SYRINGE | INTRAVENOUS | Status: DC | PRN
  Administered 2021-11-05: 40 mg via INTRAVENOUS

## 2021-11-05 MED ORDER — ONDANSETRON HCL 4 MG/2ML IJ SOLN: 4.0000 mg | Freq: Once | INTRAMUSCULAR | Status: DC | PRN

## 2021-11-05 MED ORDER — MIDAZOLAM HCL 2 MG/2ML IJ SOLN
INTRAMUSCULAR | Status: AC
  Filled 2021-11-05: qty 2

## 2021-11-05 MED ORDER — SODIUM CHLORIDE 0.9 % IV SOLN: INTRAVENOUS | Status: DC

## 2021-11-05 MED ORDER — PHENYLEPHRINE HCL (PRESSORS) 10 MG/ML IV SOLN
INTRAVENOUS | Status: DC | PRN
  Administered 2021-11-05 (×5): 40 ug via INTRAVENOUS

## 2021-11-05 MED ORDER — CEFAZOLIN SODIUM-DEXTROSE 2-4 GM/100ML-% IV SOLN
INTRAVENOUS | Status: AC
  Filled 2021-11-05: qty 100

## 2021-11-05 MED ORDER — DOCUSATE SODIUM 100 MG PO CAPS: 100.0000 mg | ORAL_CAPSULE | Freq: Two times a day (BID) | ORAL | 0 refills | Status: DC

## 2021-11-05 MED ORDER — PROPOFOL 10 MG/ML IV BOLUS
INTRAVENOUS | Status: DC | PRN
  Administered 2021-11-05: 150 mg via INTRAVENOUS

## 2021-11-05 MED ORDER — VANCOMYCIN HCL 500 MG IV SOLR
INTRAVENOUS | Status: DC | PRN
  Administered 2021-11-05: 500 mg via TOPICAL

## 2021-11-05 MED ORDER — CEFAZOLIN SODIUM-DEXTROSE 2-4 GM/100ML-% IV SOLN
2.0000 g | INTRAVENOUS | Status: AC
  Administered 2021-11-05: 2 g via INTRAVENOUS

## 2021-11-05 MED ORDER — FENTANYL CITRATE (PF) 100 MCG/2ML IJ SOLN: 25.0000 ug | INTRAMUSCULAR | Status: DC | PRN

## 2021-11-05 MED ORDER — OXYCODONE HCL 5 MG PO TABS
ORAL_TABLET | ORAL | Status: AC
  Filled 2021-11-05: qty 1

## 2021-11-05 MED ORDER — OXYCODONE HCL 5 MG/5ML PO SOLN: 5.0000 mg | Freq: Once | ORAL | Status: AC | PRN

## 2021-11-05 MED ORDER — BUPIVACAINE-EPINEPHRINE (PF) 0.5% -1:200000 IJ SOLN
INTRAMUSCULAR | Status: DC | PRN
  Administered 2021-11-05: 25 mL via PERINEURAL

## 2021-11-05 MED ORDER — LACTATED RINGERS IV SOLN
INTRAVENOUS | Status: DC
Start: 2021-11-05 — End: 2021-11-05

## 2021-11-05 MED ORDER — PROPOFOL 500 MG/50ML IV EMUL
INTRAVENOUS | Status: DC | PRN
  Administered 2021-11-05: 25 ug/kg/min via INTRAVENOUS

## 2021-11-05 MED ORDER — BUPIVACAINE-EPINEPHRINE (PF) 0.5% -1:200000 IJ SOLN
INTRAMUSCULAR | Status: AC
  Filled 2021-11-05: qty 30

## 2021-11-05 MED ORDER — VANCOMYCIN HCL 500 MG IV SOLR
INTRAVENOUS | Status: AC
  Filled 2021-11-05: qty 10

## 2021-11-05 MED ORDER — ONDANSETRON HCL 4 MG/2ML IJ SOLN
INTRAMUSCULAR | Status: DC | PRN
  Administered 2021-11-05: 4 mg via INTRAVENOUS

## 2021-11-05 MED ORDER — FENTANYL CITRATE (PF) 100 MCG/2ML IJ SOLN
INTRAMUSCULAR | Status: AC
  Filled 2021-11-05: qty 2

## 2021-11-05 MED ORDER — CEFAZOLIN SODIUM-DEXTROSE 2-4 GM/100ML-% IV SOLN: 2.0000 g | INTRAVENOUS | Status: DC

## 2021-11-05 MED ORDER — FENTANYL CITRATE (PF) 100 MCG/2ML IJ SOLN
50.0000 ug | Freq: Once | INTRAMUSCULAR | Status: AC
  Administered 2021-11-05: 50 ug via INTRAVENOUS

## 2021-11-05 SURGICAL SUPPLY — 94 items
APL PRP STRL LF DISP 70% ISPRP (MISCELLANEOUS) ×2
APL SKNCLS STERI-STRIP NONHPOA (GAUZE/BANDAGES/DRESSINGS)
BANDAGE ESMARK 6X9 LF (GAUZE/BANDAGES/DRESSINGS) IMPLANT
BENZOIN TINCTURE PRP APPL 2/3 (GAUZE/BANDAGES/DRESSINGS) IMPLANT
BIT DRILL 1.5X85 (BIT) IMPLANT
BIT DRILL 1.8 CANN MAX VPC (BIT) IMPLANT
BLADE AVERAGE 25X9 (BLADE) IMPLANT
BLADE LONG MED 25X9 (BLADE) ×2 IMPLANT
BLADE MICRO SAGITTAL (BLADE) IMPLANT
BLADE MINI RND TIP GREEN BEAV (BLADE) IMPLANT
BLADE OSC/SAG .038X5.5 CUT EDG (BLADE) IMPLANT
BLADE SURG 15 STRL LF DISP TIS (BLADE) ×6 IMPLANT
BLADE SURG 15 STRL SS (BLADE) ×6
BNDG CMPR 75X21 PLY HI ABS (MISCELLANEOUS)
BNDG CMPR 9X4 STRL LF SNTH (GAUZE/BANDAGES/DRESSINGS)
BNDG CMPR 9X6 STRL LF SNTH (GAUZE/BANDAGES/DRESSINGS)
BNDG ELASTIC 4X5.8 VLCR STR LF (GAUZE/BANDAGES/DRESSINGS) ×2 IMPLANT
BNDG ELASTIC 6X5.8 VLCR STR LF (GAUZE/BANDAGES/DRESSINGS) IMPLANT
BNDG ESMARK 4X9 LF (GAUZE/BANDAGES/DRESSINGS) IMPLANT
BNDG ESMARK 6X9 LF (GAUZE/BANDAGES/DRESSINGS)
BNDG GZE 12X3 1 PLY HI ABS (GAUZE/BANDAGES/DRESSINGS)
BNDG STRETCH GAUZE 3IN X12FT (GAUZE/BANDAGES/DRESSINGS) ×2 IMPLANT
BUR MIS CONICAL WEDGE 4.3X13 (BUR) IMPLANT
BURR CONICAL 4.3 (BUR)
BURR MIS CONICAL WEDGE 4.3X13 (BUR)
CHLORAPREP W/TINT 26 (MISCELLANEOUS) ×2 IMPLANT
COVER BACK TABLE 60X90IN (DRAPES) ×2 IMPLANT
CUFF TOURN SGL QUICK 24 (TOURNIQUET CUFF)
CUFF TOURN SGL QUICK 34 (TOURNIQUET CUFF) ×2
CUFF TRNQT CYL 24X4X16.5-23 (TOURNIQUET CUFF) IMPLANT
CUFF TRNQT CYL 34X4.125X (TOURNIQUET CUFF) IMPLANT
DRAPE EXTREMITY T 121X128X90 (DISPOSABLE) ×2 IMPLANT
DRAPE OEC MINIVIEW 54X84 (DRAPES) ×2 IMPLANT
DRAPE SURG 17X23 STRL (DRAPES) IMPLANT
DRAPE U-SHAPE 47X51 STRL (DRAPES) ×2 IMPLANT
DRSG MEPITEL 4X7.2 (GAUZE/BANDAGES/DRESSINGS) ×2 IMPLANT
ELECT REM PT RETURN 9FT ADLT (ELECTROSURGICAL) ×2
ELECTRODE REM PT RTRN 9FT ADLT (ELECTROSURGICAL) ×2 IMPLANT
GAUZE PAD ABD 8X10 STRL (GAUZE/BANDAGES/DRESSINGS) ×2 IMPLANT
GAUZE SPONGE 4X4 12PLY STRL (GAUZE/BANDAGES/DRESSINGS) ×2 IMPLANT
GAUZE STRETCH 2X75IN STRL (MISCELLANEOUS) IMPLANT
GLOVE BIO SURGEON STRL SZ8 (GLOVE) ×2 IMPLANT
GLOVE BIOGEL PI IND STRL 7.0 (GLOVE) IMPLANT
GLOVE BIOGEL PI IND STRL 8 (GLOVE) ×4 IMPLANT
GLOVE ECLIPSE 8.0 STRL XLNG CF (GLOVE) ×2 IMPLANT
GLOVE SURG SS PI 7.0 STRL IVOR (GLOVE) IMPLANT
GOWN STRL REUS W/ TWL LRG LVL3 (GOWN DISPOSABLE) ×2 IMPLANT
GOWN STRL REUS W/ TWL XL LVL3 (GOWN DISPOSABLE) ×4 IMPLANT
GOWN STRL REUS W/TWL LRG LVL3 (GOWN DISPOSABLE) ×4
GOWN STRL REUS W/TWL XL LVL3 (GOWN DISPOSABLE) ×6
K-WIRE COCR 0.9X95 (WIRE) ×2
K-WIRE DBL .054X9 NSTRL (WIRE) ×2
KWIRE COCR 0.9X95 (WIRE) IMPLANT
KWIRE DBL .054X9 NSTRL (WIRE) ×2 IMPLANT
NDL HYPO 25X1 1.5 SAFETY (NEEDLE) IMPLANT
NEEDLE HYPO 22GX1.5 SAFETY (NEEDLE) IMPLANT
NEEDLE HYPO 25X1 1.5 SAFETY (NEEDLE) IMPLANT
NS IRRIG 1000ML POUR BTL (IV SOLUTION) ×2 IMPLANT
PACK BASIN DAY SURGERY FS (CUSTOM PROCEDURE TRAY) ×2 IMPLANT
PAD CAST 4YDX4 CTTN HI CHSV (CAST SUPPLIES) ×2 IMPLANT
PADDING CAST ABS COTTON 4X4 ST (CAST SUPPLIES) IMPLANT
PADDING CAST COTTON 4X4 STRL (CAST SUPPLIES) ×2
PADDING CAST COTTON 6X4 STRL (CAST SUPPLIES) IMPLANT
PENCIL SMOKE EVACUATOR (MISCELLANEOUS) ×2 IMPLANT
SANITIZER HAND PURELL 535ML FO (MISCELLANEOUS) ×2 IMPLANT
SCREW CORT FT ST 2X14 (Screw) IMPLANT
SCREW CORT ST MINI HEX 2X20 (Screw) IMPLANT
SCREW CORTICAL 2.0X14 (Screw) ×2 IMPLANT
SCREW CORTICAL ST2.0X24 (Screw) IMPLANT
SCREW HCS TWIST-OFF 2.0X12MM (Screw) IMPLANT
SCREW MAX VP2.5X28 (Screw) IMPLANT
SHEET MEDIUM DRAPE 40X70 STRL (DRAPES) ×2 IMPLANT
SLEEVE SCD COMPRESS KNEE MED (STOCKING) ×2 IMPLANT
SPIKE FLUID TRANSFER (MISCELLANEOUS) IMPLANT
SPLINT PLASTER CAST FAST 5X30 (CAST SUPPLIES) IMPLANT
SPONGE T-LAP 18X18 ~~LOC~~+RFID (SPONGE) ×2 IMPLANT
STOCKINETTE 6  STRL (DRAPES) ×2
STOCKINETTE 6 STRL (DRAPES) ×2 IMPLANT
STRIP CLOSURE SKIN 1/2X4 (GAUZE/BANDAGES/DRESSINGS) IMPLANT
SUCTION FRAZIER HANDLE 10FR (MISCELLANEOUS) ×2
SUCTION TUBE FRAZIER 10FR DISP (MISCELLANEOUS) ×2 IMPLANT
SUT ETHILON 3 0 PS 1 (SUTURE) ×2 IMPLANT
SUT MNCRL AB 3-0 PS2 18 (SUTURE) ×2 IMPLANT
SUT VIC AB 2-0 SH 18 (SUTURE) IMPLANT
SUT VIC AB 2-0 SH 27 (SUTURE) ×2
SUT VIC AB 2-0 SH 27XBRD (SUTURE) IMPLANT
SUT VICRYL 0 SH 27 (SUTURE) IMPLANT
SUT VICRYL 0 UR6 27IN ABS (SUTURE) IMPLANT
SYR BULB EAR ULCER 3OZ GRN STR (SYRINGE) ×2 IMPLANT
SYR CONTROL 10ML LL (SYRINGE) IMPLANT
TOWEL GREEN STERILE FF (TOWEL DISPOSABLE) ×2 IMPLANT
TUBE CONNECTING 20X1/4 (TUBING) IMPLANT
UNDERPAD 30X36 HEAVY ABSORB (UNDERPADS AND DIAPERS) ×2 IMPLANT
YANKAUER SUCT BULB TIP NO VENT (SUCTIONS) IMPLANT

## 2021-11-05 NOTE — Progress Notes (Signed)
Assisted Dr. Royce Macadamia with right, popliteal/saphenous, ultrasound guided block. Side rails up, monitors on throughout procedure. See vital signs in flow sheet. Tolerated Procedure well.

## 2021-11-05 NOTE — Anesthesia Procedure Notes (Signed)
Procedure Name: LMA Insertion Date/Time: 11/05/2021 7:36 AM  Performed by: Olia Hinderliter, Ernesta Amble, CRNAPre-anesthesia Checklist: Patient identified, Emergency Drugs available, Suction available and Patient being monitored Patient Re-evaluated:Patient Re-evaluated prior to induction Oxygen Delivery Method: Circle system utilized Preoxygenation: Pre-oxygenation with 100% oxygen Induction Type: IV induction Ventilation: Mask ventilation without difficulty LMA: LMA inserted LMA Size: 3.0 Number of attempts: 1 Airway Equipment and Method: Bite block Placement Confirmation: positive ETCO2 Tube secured with: Tape Dental Injury: Teeth and Oropharynx as per pre-operative assessment

## 2021-11-05 NOTE — Discharge Instructions (Addendum)
Wylene Simmer, MD [facility]  Please read the following information regarding your care after surgery.  Medications  You only need a prescription for the narcotic pain medicine (ex. oxycodone, Percocet, Norco).  All of the other medicines listed below are available over the counter. ? Aleve 2 pills twice a day for the first 3 days after surgery. ? acetominophen (Tylenol) 650 mg every 4-6 hours as you need for minor to moderate pain ? oxycodone as prescribed for severe pain  Narcotic pain medicine (ex. oxycodone, Percocet, Vicodin) will cause constipation.  To prevent this problem, take the following medicines while you are taking any pain medicine. ? docusate sodium (Colace) 100 mg twice a day ? senna (Senokot) 2 tablets twice a day  Weight Bearing  ? Bear weight only on your operated foot, on your heel, in the post-op shoe.   Cast / Splint / Dressing ? Keep your splint, cast or dressing clean and dry.  Don't put anything (coat hanger, pencil, etc) down inside of it.  If it gets damp, use a hair dryer on the cool setting to dry it.  If it gets soaked, call the office to schedule an appointment for a cast change.   After your dressing, cast or splint is removed; you may shower, but do not soak or scrub the wound.  Allow the water to run over it, and then gently pat it dry.  Swelling It is normal for you to have swelling where you had surgery.  To reduce swelling and pain, keep your toes above your nose for at least 3 days after surgery.  It may be necessary to keep your foot or leg elevated for several weeks.  If it hurts, it should be elevated.  Follow Up Call my office at [phone] when you are discharged from the hospital or surgery center to schedule an appointment to be seen two weeks after surgery.  Call my office at [phone] if you develop a fever >101.5 F, nausea, vomiting, bleeding from the surgical site or severe pain.       Post Anesthesia Home Care  Instructions  Activity: Get plenty of rest for the remainder of the day. A responsible individual must stay with you for 24 hours following the procedure.  For the next 24 hours, DO NOT: -Drive a car -Paediatric nurse -Drink alcoholic beverages -Take any medication unless instructed by your physician -Make any legal decisions or sign important papers.  Meals: Start with liquid foods such as gelatin or soup. Progress to regular foods as tolerated. Avoid greasy, spicy, heavy foods. If nausea and/or vomiting occur, drink only clear liquids until the nausea and/or vomiting subsides. Call your physician if vomiting continues.  Special Instructions/Symptoms: Your throat may feel dry or sore from the anesthesia or the breathing tube placed in your throat during surgery. If this causes discomfort, gargle with warm salt water. The discomfort should disappear within 24 hours.  If you had a scopolamine patch placed behind your ear for the management of post- operative nausea and/or vomiting:  1. The medication in the patch is effective for 72 hours, after which it should be removed.  Wrap patch in a tissue and discard in the trash. Wash hands thoroughly with soap and water. 2. You may remove the patch earlier than 72 hours if you experience unpleasant side effects which may include dry mouth, dizziness or visual disturbances. 3. Avoid touching the patch. Wash your hands with soap and water after contact with the patch.  Regional Anesthesia Blocks  1. Numbness or the inability to move the "blocked" extremity may last from 3-48 hours after placement. The length of time depends on the medication injected and your individual response to the medication. If the numbness is not going away after 48 hours, call your surgeon.  2. The extremity that is blocked will need to be protected until the numbness is gone and the  Strength has returned. Because you cannot feel it, you will need to take extra care to  avoid injury. Because it may be weak, you may have difficulty moving it or using it. You may not know what position it is in without looking at it while the block is in effect.  3. For blocks in the legs and feet, returning to weight bearing and walking needs to be done carefully. You will need to wait until the numbness is entirely gone and the strength has returned. You should be able to move your leg and foot normally before you try and bear weight or walk. You will need someone to be with you when you first try to ensure you do not fall and possibly risk injury.  4. Bruising and tenderness at the needle site are common side effects and will resolve in a few days.  5. Persistent numbness or new problems with movement should be communicated to the surgeon or the [place] [phone] [place] [phone]).   Oxycodone given at 10am today 11/05/21

## 2021-11-05 NOTE — H&P (Signed)
Rachel Fitzpatrick is an 61 y.o. female.   Chief Complaint: right foot pain HPI: 61 y/o female with h/o previous forefoot surgery c/o recurrent bunion deformity and 3-4 hammertoes.  She has failed non op treatment and presents today for revision bunion correction and hammertoe corrections.  Past Medical History:  Diagnosis Date   Adenomatous colon polyp    Allergy    Anemia    Anxiety    Arthritis    Blood in stool    Chronic bronchitis (HCC)    Complication of anesthesia    Reports hypotension with anesthesia   COVID-19 03/2019   Depression    GERD (gastroesophageal reflux disease)    History of hiatal hernia    Hypercholesteremia    Osteopenia     Past Surgical History:  Procedure Laterality Date   APPENDECTOMY     COLONOSCOPY     EYE SURGERY     FOOT SURGERY     HAND SURGERY     tendon repair   ORIF WRIST FRACTURE Left 05/03/2016   Procedure: OPEN REDUCTION INTERNAL FIXATION (ORIF) WRIST FRACTURE/LEFT;  Surgeon: Iran Planas, MD;  Location: [facility];  Service: Orthopedics;  Laterality: Left;   RECTAL POLYPECTOMY      Family History  Problem Relation Age of Onset   Hyperlipidemia Mother    Cancer Mother        lung cancer   Hyperlipidemia Father    Diabetes Father    COPD Father    Sudden death Neg Hx    Heart attack Neg Hx    Hypertension Neg Hx    Colon cancer Neg Hx    Esophageal cancer Neg Hx    Rectal cancer Neg Hx    Stomach cancer Neg Hx    Social History:  reports that she has never smoked. She has been exposed to tobacco smoke. She has never used smokeless tobacco. She reports current alcohol use. She reports that she does not use drugs.  Allergies: No Known Allergies  Medications Prior to Admission  Medication Sig Dispense Refill   alendronate (FOSAMAX) 70 MG tablet TAKE 1 TABLET BY MOUTH EVERY 7 DAYS. TAKE WITH A FULL GLASS OF WATER ON AN EMPTY STOMACH. 12 tablet 7   Calcium Citrate-Vitamin D (CALCIUM + D PO) Take 1 tablet by mouth 2 (two) times daily.      cetirizine (ZYRTEC) 10 MG tablet Take 1 tablet (10 mg total) by mouth daily. (Patient taking differently: Take 10 mg by mouth daily as needed for allergies.) 30 tablet 11   ferrous sulfate 325 (65 FE) MG tablet TAKE 1 TABLET BY MOUTH EVERY DAY WITH BREAKFAST 90 tablet 2   montelukast (SINGULAIR) 10 MG tablet Take 10 mg by mouth at bedtime.     omeprazole (PRILOSEC) 20 MG capsule Take 20 mg by mouth daily as needed (heartburn).     rosuvastatin (CRESTOR) 20 MG tablet TAKE 1 TABLET BY MOUTH ONCE A DAY FOR CHOLESTEROL 90 tablet 1   sertraline (ZOLOFT) 100 MG tablet TAKE 2 TABLETS BY MOUTH EVERY DAY 180 tablet 1    No results found for this or any previous visit (from the past 48 hour(s)). DG MINI C-ARM IMAGE ONLY  Result Date: 11/05/2021 There is no interpretation for this exam.  This order is for images obtained during a surgical procedure.  Please See "Surgeries" Tab for more information regarding the procedure.    Review of Systems no recent f/c/n/v/wt loss  Blood pressure 130/88, pulse 76,  temperature 97.6 F (36.4 C), temperature source Oral, resp. rate 18, height 5' (1.524 m), weight 77 kg, SpO2 97 %. Physical Exam  Wn wd woman in nad.  A and O.  EOMI.  Resp unlabored.  R foot with healthy skin.  Pulses are palpable.  No lymphadenopathy.  Mod bunion.  3rd and 4th hammertoes are passively correctable.  Brisk cap refill at the toes.   Assessment/Plan R foot bunion, metatarsalgia and 3-4 hammertoes.  The risks and benefits of the alternative treatment options have been discussed in detail.  The patient wishes to proceed with surgery and specifically understands risks of bleeding, infection, nerve damage, blood clots, need for additional surgery, amputation and death.   Wylene Simmer, MD November 18, 2021, 7:28 AM

## 2021-11-05 NOTE — Anesthesia Postprocedure Evaluation (Signed)
Anesthesia Post Note  Patient: CASANDRA DALLAIRE  Procedure(s) Performed: REVISION RIGHT FOOT BUNION CORRECTION WITH DOUBLE OSTEOTOMY (Right: Foot) HARDWARE REMOVAL FROM THE 3RD METARSAL HEAD (Right: Toe) 3RD METATARSAL DORSAL CAPSULOTOMY AND EXTENSOR LENGTHENING  AND 4TH METARSAL WEIL AND LATERAL COLLATERAL LIGAMENT REPAIR (Right: Foot)     Patient location during evaluation: PACU Anesthesia Type: General Level of consciousness: awake and alert and oriented Pain management: pain level controlled Vital Signs Assessment: post-procedure vital signs reviewed and stable Respiratory status: spontaneous breathing, nonlabored ventilation and respiratory function stable Cardiovascular status: blood pressure returned to baseline and stable Postop Assessment: no apparent nausea or vomiting Anesthetic complications: no   No notable events documented.  Last Vitals:  Vitals:   11/05/21 0915 11/05/21 0939  BP: 114/73 120/74  Pulse: 86 82  Resp: 15 18  Temp:  36.7 C  SpO2: 94% 94%    Last Pain:  Vitals:   11/05/21 0939  TempSrc: Oral  PainSc: 2                  Kendric Sindelar A.

## 2021-11-05 NOTE — Transfer of Care (Signed)
Immediate Anesthesia Transfer of Care Note  Patient: Rachel Fitzpatrick  Procedure(s) Performed: REVISION RIGHT FOOT BUNION CORRECTION WITH DOUBLE OSTEOTOMY (Right: Foot) HARDWARE REMOVAL FROM THE 3RD METARSAL HEAD (Right: Toe) 3RD METATARSAL DORSAL CAPSULOTOMY AND EXTENSOR LENGTHENING  AND 4TH METARSAL WEIL AND LATERAL COLLATERAL LIGAMENT REPAIR (Right: Foot)  Patient Location: PACU  Anesthesia Type:GA combined with regional for post-op pain  Level of Consciousness: awake, alert , oriented and patient cooperative  Airway & Oxygen Therapy: Patient Spontanous Breathing and Patient connected to face mask oxygen  Post-op Assessment: Report given to RN and Post -op Vital signs reviewed and stable  Post vital signs: Reviewed and stable  Last Vitals:  Vitals Value Taken Time  BP 118/75 11/05/21 0904  Temp    Pulse 94 11/05/21 0905  Resp    SpO2 100 % 11/05/21 0905  Vitals shown include unvalidated device data.  Last Pain:  Vitals:   11/05/21 0623  TempSrc: Oral  PainSc: 0-No pain      Patients Stated Pain Goal: 3 (41/32/44 0102)  Complications: No notable events documented.

## 2021-11-05 NOTE — Anesthesia Procedure Notes (Signed)
Anesthesia Regional Block: Popliteal block   Pre-Anesthetic Checklist: , timeout performed,  Correct Patient, Correct Site, Correct Laterality,  Correct Procedure, Correct Position, site marked,  Risks and benefits discussed,  Surgical consent,  Pre-op evaluation,  At surgeon's request and post-op pain management  Laterality: Right  Prep: chloraprep       Needles:  Injection technique: Single-shot  Needle Type: Echogenic Stimulator Needle     Needle Length: 10cm  Needle Gauge: 21   Needle insertion depth: 6 cm   Additional Needles:   Procedures:,,,, ultrasound used (permanent image in chart),,    Narrative:  Start time: 11/05/2021 7:20 AM End time: 11/05/2021 7:25 AM Injection made incrementally with aspirations every 5 mL.  Performed by: Personally  Anesthesiologist: Josephine Igo, MD  Additional Notes: Timeout performed. Patient sedated. Relevant anatomy ID'd using [place]. Incremental 2-68m injection of LA with frequent aspiration. Patient tolerated procedure well.     Right Popliteal Block

## 2021-11-05 NOTE — Op Note (Signed)
11/05/2021  9:15 AM  PATIENT:  Rachel Fitzpatrick  61 y.o. female  PRE-OPERATIVE DIAGNOSIS: 1.  Right foot recurrent bunion deformity 2.  Painful hardware right third MTP joint with recurrent hammertoe deformity of the third toe 3.  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  Right foot recurrent bunion deformity 2.  Painful hardware right third MTP joint with recurrent hammertoe deformity of the third toe 3.  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  Right second MTP joint painful hardware and recurrent hammertoe deformity of the second toe  Procedure(s): 1.  Correction of right forefoot bunion deformity with double osteotomy (scarf/Aiken) 2.  Right second and third metatarsal removal of deep implant 3.  Right second and third MTP joint dorsal capsulotomy and extensor tendon lengthening 4.  Right fourth metatarsal Weil osteotomy 5.  Right fourth MTP joint lateral collateral ligament repair 6.  Right foot AP, lateral and oblique radiographs  SURGEON:  Wylene Simmer, MD  ASSISTANT: Mechele Claude, PA-C  ANESTHESIA:   General, regional  EBL:  minimal   TOURNIQUET:   Total Tourniquet Time Documented: Thigh (Right) - 69 minutes Total: Thigh (Right) - 69 minutes  COMPLICATIONS:  None apparent  DISPOSITION:  Extubated, awake and stable to recovery.  INDICATION FOR PROCEDURE: The patient is a 61 year old female with previous right forefoot bunion surgery and hammertoe surgery several years ago.  She has developed recurrent hammertoe deformities and significant stiffness of the second and third MTP joints.  She has painful hardware at both the second and third metatarsal heads.  The fourth toe is curling under the third now as well with lateral collateral ligament insufficiency and metatarsalgia.  Her bunion has recurred as well.  She has failed nonoperative treatment to date and  presents today for revision right forefoot reconstruction.  The risks and benefits of the alternative treatment options have been discussed in detail.  The patient wishes to proceed with surgery and specifically understands risks of bleeding, infection, nerve damage, blood clots, need for additional surgery, amputation and death.   PROCEDURE IN DETAIL:  After pre operative consent was obtained, and the correct operative site was identified, the patient was brought to the operating room and placed supine on the OR table.  Anesthesia was administered.  Pre-operative antibiotics were administered.  A surgical timeout was taken.  The right lower extremity was prepped and draped in standard sterile fashion with a tourniquet around the thigh.  The extremity was elevated, and the tourniquet was inflated to 250 mmHg.  A dorsal incision was made over the first webspace.  Dissection was carried sharply down through the subcutaneous tissues.  The intermetatarsal ligament was divided under direct vision.  An arthrotomy was then made between the lateral sesamoid and the metatarsal head.  Attention was turned to the medial forefoot where a longitudinal incision was made from the IP joint of the hallux back to the base of the first metatarsal.  Dissection was carried sharply down through the subcutaneous tissues.  The medial joint capsule was incised and elevated dorsally and plantarly.  A scarf osteotomy was then made in the first metatarsal after marking the corners with K wires.  The osteotomy was mobilized and the capital fragment translated laterally to correct the intermetatarsal and hallux valgus angles.  Radiographs confirmed appropriate position of the osteotomy.  The osteotomy was then fixed with 2 [employer] stainless steel 2 mm screws.  Overhanging  bone was trimmed with the oscillating saw.  Radiographs revealed a bit of residual hallux valgus interphalangeus.  The decision was made to proceed with [facility]  osteotomy of the proximal phalanx.  The dorsal and plantar tendons were protected.  A closing wedge medial osteotomy was made with the oscillating saw.  The osteotomy was closed and fixed with a 2.5 mm [employer] VPC screw.  Radiographs confirmed appropriate correction of the bunion deformity in appropriate position and length of the hardware.  The wound was irrigated copiously and sprinkled with vancomycin powder.  The medial joint capsule was repaired with 2-0 Vicryl.  Subcutaneous tissues were approximated with Vicryl.  Skin incision was closed with running 3-0 nylon.  The dorsal first metatarsal incision was also closed with nylon.  Attention was turned to the second webspace scar.  An incision was made and dissection carried down to the third MTP joint.  The dorsal joint capsule was excised after lengthening the extensor tendons.  The MTP joint was mobilized with a Research scientist (life sciences).  The screw head was identified and cleaned of all overgrown bone and soft tissue.  The screw was removed without difficulty.  Third toe was noted to be passively plantarflexed to a neutral position.  However at this point the second toe was noted to be relatively dorsiflexed with a tendency to cross over.  The decision was made to proceed with hardware removal and extensor tendon lengthening.  The procedure was performed as described for the third MTP joint.  The second toe would now reduce to a neutral position passively.  Attention was turned to the fourth ray.  A longitudinal incision was made over the MTP joint.  Dissection was carried down through the subcutaneous tissues.  The extensor tendons were lengthened and the dorsal joint capsule incised.  A fourth metatarsal Weil osteotomy was made with the oscillating saw.  The metatarsal head was allowed to retract proximally and fixed with a 2 mm [employer] FRS screw.  Overhanging bone was trimmed with a rondure.  The lateral collateral ligament was then repaired with a  box suture of 0 Vicryl.  This held the toe reduced to a neutral position.  Final AP, lateral and oblique radiographs confirmed appropriate shortening of the fourth metatarsal and position of the screw as well as correction of the bunion deformity with scarf and Akin osteotomies.  Interval removal of the third and second metatarsal screws was also noted.  The dorsal wounds were then irrigated copiously and closed with nylon after sprinkling with vancomycin powder.  Sterile dressings were applied followed by a bunion wrap.  The tourniquet was released after application of the dressings.  The patient was awakened from anesthesia and transported to the recovery room in stable condition.  FOLLOW UP PLAN: Weightbearing as tolerated in a Darco shoe.  Follow-up in the office in 2 weeks for suture removal.  She will need a toe spacer between the hallux and the second toe as well as taping of the fourth toe to the fifth toe and plantarflexion taping of the second and third toes.  No indication for DVT prophylaxis in this ambulatory patient.   RADIOGRAPHS:AP, lateral and oblique radiographs of the right foot were obtained intraoperatively.  These confirmed appropriate shortening of the fourth metatarsal and position of the screw as well as correction of the bunion deformity with scarf and Akin osteotomies.  Interval removal of the third and second metatarsal screws was also noted.    Mechele Claude PA-C was  present and scrubbed for the duration of the operative case. His assistance was essential in positioning the patient, prepping and draping, gaining and maintaining exposure, performing the operation, closing and dressing the wounds and applying the splint.

## 2021-11-05 NOTE — Anesthesia Procedure Notes (Signed)
Anesthesia Regional Block: Adductor canal block   Pre-Anesthetic Checklist: , timeout performed,  Correct Patient, Correct Site, Correct Laterality,  Correct Procedure, Correct Position, site marked,  Risks and benefits discussed,  Surgical consent,  Pre-op evaluation,  At surgeon's request and post-op pain management  Laterality: Right  Prep: chloraprep       Needles:  Injection technique: Single-shot  Needle Type: Echogenic Stimulator Needle     Needle Length: 10cm  Needle Gauge: 21   Needle insertion depth: 7 cm   Additional Needles:   Procedures:,,,, ultrasound used (permanent image in chart),,    Narrative:  Start time: 11/05/2021 7:25 AM End time: 11/05/2021 7:30 AM Injection made incrementally with aspirations every 5 mL.  Performed by: Personally  Anesthesiologist: Josephine Igo, MD  Additional Notes: Timeout performed. Patient sedated. Relevant anatomy ID'd using [place]. Incremental 2-38m injection of LA with frequent aspiration. Patient tolerated procedure well.     Right Adductor Canal Block

## 2021-11-06 ENCOUNTER — Encounter (HOSPITAL_BASED_OUTPATIENT_CLINIC_OR_DEPARTMENT_OTHER): Payer: Self-pay | Admitting: Orthopedic Surgery

## 2021-12-22 ENCOUNTER — Ambulatory Visit: Payer: Managed Care, Other (non HMO) | Admitting: Family Medicine

## 2021-12-24 ENCOUNTER — Encounter: Payer: Self-pay | Admitting: Family Medicine

## 2021-12-24 ENCOUNTER — Ambulatory Visit: Payer: Managed Care, Other (non HMO) | Admitting: Family Medicine

## 2021-12-24 VITALS — BP 116/76 | HR 80 | Temp 99.1°F | Resp 17 | Ht 61.0 in | Wt 167.5 lb

## 2021-12-24 DIAGNOSIS — E785 Hyperlipidemia, unspecified: Secondary | ICD-10-CM

## 2021-12-24 DIAGNOSIS — E669 Obesity, unspecified: Secondary | ICD-10-CM | POA: Diagnosis not present

## 2021-12-24 DIAGNOSIS — E66811 Obesity, class 1: Secondary | ICD-10-CM

## 2021-12-24 LAB — LIPID PANEL
Cholesterol: 233 mg/dL — ABNORMAL HIGH (ref 0–200)
HDL: 105 mg/dL (ref >39.00)
LDL Cholesterol: 115 mg/dL — ABNORMAL HIGH (ref 0–99)
NonHDL: 128.12
Total CHOL/HDL Ratio: 2
Triglycerides: 68 mg/dL (ref 0.0–149.0)
VLDL: 13.6 mg/dL (ref 0.0–40.0)

## 2021-12-24 LAB — CBC WITH DIFFERENTIAL/PLATELET
Basophils Absolute: 0 10*3/uL (ref 0.0–0.1)
Basophils Relative: 0.4 % (ref 0.0–3.0)
Eosinophils Absolute: 0 10*3/uL (ref 0.0–0.7)
Eosinophils Relative: 1.3 % (ref 0.0–5.0)
HCT: 35.3 % — ABNORMAL LOW (ref 36.0–46.0)
Hemoglobin: 12 g/dL (ref 12.0–15.0)
Lymphocytes Relative: 47 % — ABNORMAL HIGH (ref 12.0–46.0)
Lymphs Abs: 1.8 10*3/uL (ref 0.7–4.0)
MCHC: 34 g/dL (ref 30.0–36.0)
MCV: 90.6 fl (ref 78.0–100.0)
Monocytes Absolute: 0.3 10*3/uL (ref 0.1–1.0)
Monocytes Relative: 8.2 % (ref 3.0–12.0)
Neutro Abs: 1.6 10*3/uL (ref 1.4–7.7)
Neutrophils Relative %: 43.1 % (ref 43.0–77.0)
Platelets: 175 10*3/uL (ref 150.0–400.0)
RBC: 3.9 Mil/uL (ref 3.87–5.11)
RDW: 13.7 % (ref 11.5–15.5)
WBC: 3.7 10*3/uL — ABNORMAL LOW (ref 4.0–10.5)

## 2021-12-24 LAB — BASIC METABOLIC PANEL
BUN: 23 mg/dL (ref 6–23)
CO2: 24 mEq/L (ref 19–32)
Calcium: 8.8 mg/dL (ref 8.4–10.5)
Chloride: 106 mEq/L (ref 96–112)
Creatinine, Ser: 0.7 mg/dL (ref 0.40–1.20)
GFR: 94.34 mL/min (ref >60.00)
Glucose, Bld: 101 mg/dL — ABNORMAL HIGH (ref 70–99)
Potassium: 4.1 mEq/L (ref 3.5–5.1)
Sodium: 138 mEq/L (ref 135–145)

## 2021-12-24 LAB — HEPATIC FUNCTION PANEL
ALT: 15 U/L (ref 0–35)
AST: 29 U/L (ref 0–37)
Albumin: 4.3 g/dL (ref 3.5–5.2)
Alkaline Phosphatase: 64 U/L (ref 39–117)
Bilirubin, Direct: 0.1 mg/dL (ref 0.0–0.3)
Total Bilirubin: 0.3 mg/dL (ref 0.2–1.2)
Total Protein: 7.2 g/dL (ref 6.0–8.3)

## 2021-12-24 LAB — TSH: TSH: 1.55 u[IU]/mL (ref 0.35–5.50)

## 2021-12-24 NOTE — Patient Instructions (Signed)
Schedule your complete physical in 6 months We'll notify you of your lab results and make any changes if needed Continue to work on healthy diet and regular exercise- you can do it! Call with any questions or concerns Stay Safe!  Stay Healthy! Congrats on the new job!!!

## 2021-12-24 NOTE — Assessment & Plan Note (Signed)
Pt has lost 4 lbs since last visit.  Pt is not exercising due to recent foot surgery.  Check labs to risk stratify.  Will follow.

## 2021-12-24 NOTE — Assessment & Plan Note (Signed)
Chronic problem.  On Crestor 20mg daily w/o difficulty.  Check labs.  Adjust meds prn  

## 2021-12-24 NOTE — Progress Notes (Signed)
   Subjective:    Patient ID: Rachel Fitzpatrick, female    DOB: Dec 13, 1960, 61 y.o.   MRN: [id number]  HPI Hyperlipidemia- chronic problem, on Crestor '20mg'$  daily.  Denies CP, SOB, HAs, visual changes, abd pain, N/V.  Obesity- pt is down 4 lbs since last visit.  Pt has not been exercising due to R foot surgery   Review of Systems For ROS see HPI     Objective:   Physical Exam Vitals reviewed.  Constitutional:      General: She is not in acute distress.    Appearance: Normal appearance. She is well-developed. She is not ill-appearing.  HENT:     Head: Normocephalic and atraumatic.  Eyes:     Conjunctiva/sclera: Conjunctivae normal.     Pupils: Pupils are equal, round, and reactive to light.  Neck:     Thyroid: No thyromegaly.  Cardiovascular:     Rate and Rhythm: Normal rate and regular rhythm.     Pulses: Normal pulses.     Heart sounds: Normal heart sounds. No murmur heard. Pulmonary:     Effort: Pulmonary effort is normal. No respiratory distress.     Breath sounds: Normal breath sounds.  Abdominal:     General: There is no distension.     Palpations: Abdomen is soft.     Tenderness: There is no abdominal tenderness.  Musculoskeletal:     Cervical back: Normal range of motion and neck supple.  Lymphadenopathy:     Cervical: No cervical adenopathy.  Skin:    General: Skin is warm and dry.  Neurological:     General: No focal deficit present.     Mental Status: She is alert and oriented to person, place, and time.  Psychiatric:        Mood and Affect: Mood normal.        Behavior: Behavior normal.           Assessment & Plan:

## 2021-12-25 NOTE — Progress Notes (Signed)
Informed pt of lab results  

## 2022-01-19 ENCOUNTER — Other Ambulatory Visit: Payer: Self-pay | Admitting: Family Medicine

## 2022-02-10 ENCOUNTER — Other Ambulatory Visit: Payer: Self-pay | Admitting: Family Medicine

## 2022-03-26 ENCOUNTER — Telehealth: Payer: Self-pay | Admitting: Pulmonary Disease

## 2022-03-26 NOTE — Telephone Encounter (Signed)
Spoke with pt who has had a cough for 2 weeks now. Pt states the cough is mostly dry. Pt denies SOB/ wheezing/ GI upset/ fever/ chills. Pt states she did take Tessalon pearls last night but they did not help any. Pt denies taking any other OTC cough meds. Beth please advise as Dr. Erin Fulling is not available

## 2022-03-26 NOTE — Telephone Encounter (Signed)
Spoke with pt and reviewed Beth's recommendations. Pt stated understanding. Nothing further needed at this time.

## 2022-03-26 NOTE — Telephone Encounter (Signed)
Patient of Dr. Erin Fulling last seen in August. Make sure if she is taking singular, zyrtec, flonase and omeprazole. Recommend she take delsym cough syrup twice daily. If not better needs visit with him.

## 2022-04-01 ENCOUNTER — Encounter: Payer: Self-pay | Admitting: Nurse Practitioner

## 2022-04-01 ENCOUNTER — Telehealth: Payer: Self-pay | Admitting: Pulmonary Disease

## 2022-04-01 ENCOUNTER — Ambulatory Visit (INDEPENDENT_AMBULATORY_CARE_PROVIDER_SITE_OTHER): Payer: BLUE CROSS/BLUE SHIELD

## 2022-04-01 ENCOUNTER — Other Ambulatory Visit: Payer: Self-pay

## 2022-04-01 ENCOUNTER — Ambulatory Visit: Payer: BLUE CROSS/BLUE SHIELD | Admitting: Nurse Practitioner

## 2022-04-01 ENCOUNTER — Telehealth: Payer: Self-pay | Admitting: Internal Medicine

## 2022-04-01 ENCOUNTER — Other Ambulatory Visit (INDEPENDENT_AMBULATORY_CARE_PROVIDER_SITE_OTHER): Payer: BLUE CROSS/BLUE SHIELD

## 2022-04-01 VITALS — BP 112/70 | HR 84 | Ht 61.0 in | Wt 170.2 lb

## 2022-04-01 DIAGNOSIS — R058 Other specified cough: Secondary | ICD-10-CM | POA: Insufficient documentation

## 2022-04-01 DIAGNOSIS — R059 Cough, unspecified: Secondary | ICD-10-CM | POA: Diagnosis not present

## 2022-04-01 DIAGNOSIS — J4520 Mild intermittent asthma, uncomplicated: Secondary | ICD-10-CM | POA: Diagnosis not present

## 2022-04-01 DIAGNOSIS — R053 Chronic cough: Secondary | ICD-10-CM | POA: Diagnosis not present

## 2022-04-01 DIAGNOSIS — J019 Acute sinusitis, unspecified: Secondary | ICD-10-CM

## 2022-04-01 DIAGNOSIS — B9689 Other specified bacterial agents as the cause of diseases classified elsewhere: Secondary | ICD-10-CM | POA: Diagnosis not present

## 2022-04-01 LAB — CBC WITH DIFFERENTIAL/PLATELET
Basophils Absolute: 0 10*3/uL (ref 0.0–0.1)
Basophils Relative: 0.3 % (ref 0.0–3.0)
Eosinophils Absolute: 0.1 10*3/uL (ref 0.0–0.7)
Eosinophils Relative: 1.2 % (ref 0.0–5.0)
HCT: 35.3 % — ABNORMAL LOW (ref 36.0–46.0)
Hemoglobin: 12 g/dL (ref 12.0–15.0)
Lymphocytes Relative: 26 % (ref 12.0–46.0)
Lymphs Abs: 1.7 10*3/uL (ref 0.7–4.0)
MCHC: 34.1 g/dL (ref 30.0–36.0)
MCV: 88.7 fl (ref 78.0–100.0)
Monocytes Absolute: 0.4 10*3/uL (ref 0.1–1.0)
Monocytes Relative: 6.9 % (ref 3.0–12.0)
Neutro Abs: 4.2 10*3/uL (ref 1.4–7.7)
Neutrophils Relative %: 65.6 % (ref 43.0–77.0)
Platelets: 219 10*3/uL (ref 150.0–400.0)
RBC: 3.98 Mil/uL (ref 3.87–5.11)
RDW: 13.3 % (ref 11.5–15.5)
WBC: 6.4 10*3/uL (ref 4.0–10.5)

## 2022-04-01 LAB — POCT EXHALED NITRIC OXIDE: FeNO level (ppb): 19

## 2022-04-01 MED ORDER — PREDNISONE 20 MG PO TABS: 20.0000 mg | ORAL_TABLET | Freq: Every day | ORAL | 0 refills | Status: AC

## 2022-04-01 MED ORDER — HYDROCODONE BIT-HOMATROP MBR 5-1.5 MG/5ML PO SOLN: 5.0000 mL | Freq: Four times a day (QID) | ORAL | 0 refills | Status: DC | PRN

## 2022-04-01 MED ORDER — BENZONATATE 200 MG PO CAPS: 200.0000 mg | ORAL_CAPSULE | Freq: Three times a day (TID) | ORAL | 1 refills | Status: DC | PRN

## 2022-04-01 MED ORDER — AMOXICILLIN 875 MG PO TABS: 875.0000 mg | ORAL_TABLET | Freq: Two times a day (BID) | ORAL | 0 refills | Status: AC

## 2022-04-01 NOTE — Progress Notes (Signed)
$'@Patient'C$  ID: Rachel Fitzpatrick, female    DOB: 06/20/1960, 62 y.o.   MRN: [id number]  Chief Complaint  Patient presents with   Follow-up    Pt acute visit for persistent cough fir 2wks. Denies taking any covid, flu, RSV test. Denies fevers. States the only thing that helped was delsym and flonase. But she got worse again. Drainage started Wednesday.     Referring provider: Midge Minium, MD  HPI: 62 year old female, never smoker followed for chronic bronchitis, reactive airway disease. She is a patient of Dr. August Albino and last seen in office 10/27/2021. Past medical history significant for allergic rhinitis, hiatal hernia, GERD, OA, HLD, anxiety, IDA.   TEST/EVENTS:   10/27/2021: OV with Dr. Erin Fulling.  Initially seen May 2023 for consultation due to chronic cough/recurrent bronchitis.  Feeling well since last visit.  Continues on omeprazole 20 mg daily and starting to elevate the head of her bed at night.  Tries to eat 2 hours before bedtime as often as possible.  PFTs are normal.  Concern for some reactive airway disease since having COVID in 2020.  Ongoing triggers which include her reflux with hiatal hernia and seasonal allergies.  Symptoms have resolved since treating allergies and GERD.  Not had any viral infections since last visit as well.  Follow-up as needed.  04/01/2022: Today-acute Patient presents today for acute visit.  She contacted the office on 126 with reports of persistent cough x 2 weeks.  She had started [employer] at home without much relief.  She was advised to continue using Singulair, Zyrtec, Flonase and omeprazole.  She was also encouraged to start Delsym cough syrup twice daily.  Recommended to come in for follow-up if no improvement.  Today, she tells me that she was initially feeling somewhat better with using the above recommendations.  Unfortunately, over the last few days her symptoms seem to have gotten worse.  Cough was previously dry but she is now producing  a small amount of yellow phlegm.  She also had more nasal congestion now and purulent drainage.  Prior to this, she was only having a little bit of postnasal drip.  She is having some associated headaches and voice hoarseness.  Cough is more bothersome at night.  Denies any shortness of breath, wheezing, fevers, chills, hemoptysis, sore throat.  She did not complete any viral testing when the cough first started.  No Known Allergies  Immunization History  Administered Date(s) Administered   Influenza,inj,Quad PF,6+ Mos 11/30/2016, 12/16/2017, 12/19/2017, 11/24/2018, 12/14/2019, 11/27/2020   Influenza,inj,quad, With Preservative 11/24/2018, 12/14/2019   Janssen (J&J) SARS-COV-2 Vaccination 06/07/2019   Tdap 08/07/2009, 06/20/2020   Zoster Recombinat (Shingrix) 11/24/2020, 02/03/2021    Past Medical History:  Diagnosis Date   Adenomatous colon polyp    Allergy    Anemia    Anxiety    Arthritis    Blood in stool    Chronic bronchitis ([place])    Complication of anesthesia    Reports hypotension with anesthesia   COVID-19 03/2019   Depression    GERD (gastroesophageal reflux disease)    History of hiatal hernia    Hypercholesteremia    Osteopenia     Tobacco History: Social History   Tobacco Use  Smoking Status Never   Passive exposure: Past  Smokeless Tobacco Never   Counseling given: Not Answered   Outpatient Medications Prior to Visit  Medication Sig Dispense Refill   alendronate (FOSAMAX) 70 MG tablet TAKE 1 TABLET BY MOUTH  EVERY 7 DAYS. TAKE WITH A FULL GLASS OF WATER ON AN EMPTY STOMACH. 12 tablet 7   Calcium Citrate-Vitamin D (CALCIUM + D PO) Take 1 tablet by mouth 2 (two) times daily.     cetirizine (ZYRTEC) 10 MG tablet Take 1 tablet (10 mg total) by mouth daily. (Patient taking differently: Take 10 mg by mouth daily as needed for allergies.) 30 tablet 11   montelukast (SINGULAIR) 10 MG tablet Take 10 mg by mouth at bedtime.     omeprazole (PRILOSEC) 20 MG capsule  Take 20 mg by mouth daily as needed (heartburn).     rosuvastatin (CRESTOR) 20 MG tablet Take 1 tablet by mouth daily.     sertraline (ZOLOFT) 100 MG tablet Take 2 tablets by mouth daily.     montelukast (SINGULAIR) 10 MG tablet Take 1 tablet by mouth at bedtime.     cephALEXin (KEFLEX) 500 MG capsule Take 500 mg by mouth 4 (four) times daily. (Patient not taking: Reported on 04/01/2022)     docusate sodium (COLACE) 100 MG capsule Take 1 capsule (100 mg total) by mouth 2 (two) times daily. While taking narcotic pain medicine. 30 capsule 0   Omeprazole Magnesium (PRILOSEC PO) Take 20 mg by mouth daily.     rosuvastatin (CRESTOR) 20 MG tablet TAKE 1 TABLET BY MOUTH ONCE A DAY FOR CHOLESTEROL 90 tablet 1   sertraline (ZOLOFT) 100 MG tablet TAKE 2 TABLETS BY MOUTH EVERY DAY 180 tablet 1   No facility-administered medications prior to visit.     Review of Systems:   Constitutional: No weight loss or gain, night sweats, fevers, chills, or lassitude. +fatigue  HEENT: No difficulty swallowing, tooth/dental problems, or sore throat. No sneezing, itching, ear ache. +nasal congestion/drainage, headaches, post nasal drip, voice hoarseness CV:  No chest pain, orthopnea, PND, swelling in lower extremities, anasarca, dizziness, palpitations, syncope Resp: +productive, paroxysmal cough. No shortness of breath with exertion or at rest. No hemoptysis. No wheezing.  No chest wall deformity GI:  No heartburn, indigestion, abdominal pain, nausea, vomiting, diarrhea, loss of appetite  GU: No dysuria, change in color of urine, urgency or frequency.  Skin: No rash, lesions, ulcerations MSK:  No joint pain or swelling.   Neuro: No dizziness or lightheadedness.  Psych: No depression or anxiety. Mood stable.     Physical Exam:  BP 112/70   Pulse 84   Ht '5\' 1"'$  (1.549 m)   Wt 170 lb 3.2 oz (77.2 kg)   SpO2 97%   BMI 32.16 kg/m   GEN: Pleasant, interactive, well-appearing; obese; in no acute distress. HEENT:   Normocephalic and atraumatic. PERRLA. Sclera white. Nasal turbinates erythematous, moist and patent bilaterally. White rhinorrhea present. Oropharynx erythematous and moist, without exudate or edema. Sinus tenderness to palpation. No lesions, ulcerations.  NECK:  Supple w/ fair ROM. No JVD present. Normal carotid impulses w/o bruits. Thyroid symmetrical with no goiter or nodules palpated. No lymphadenopathy.   CV: RRR, no m/r/g, no peripheral edema. Pulses intact, +2 bilaterally. No cyanosis, pallor or clubbing. PULMONARY:  Unlabored, regular breathing. Clear bilaterally A&P w/o wheezes/rales/rhonchi. Reactive cough. No accessory muscle use.  GI: BS present and normoactive. Soft, non-tender to palpation. No organomegaly or masses detected.  MSK: No erythema, warmth or tenderness. Cap refil <2 sec all extrem. No deformities or joint swelling noted.  Neuro: A/Ox3. No focal deficits noted.   Skin: Warm, no lesions or rashe Psych: Normal affect and behavior. Judgement and thought content appropriate.  Lab Results:  CBC    Component Value Date/Time   WBC 6.4 04/01/2022 1518   RBC 3.98 04/01/2022 1518   HGB 12.0 04/01/2022 1518   HCT 35.3 (L) 04/01/2022 1518   PLT 219.0 04/01/2022 1518   MCV 88.7 04/01/2022 1518   MCH 31.0 06/06/2017 0808   MCHC 34.1 04/01/2022 1518   RDW 13.3 04/01/2022 1518   LYMPHSABS 1.7 04/01/2022 1518   MONOABS 0.4 04/01/2022 1518   EOSABS 0.1 04/01/2022 1518   BASOSABS 0.0 04/01/2022 1518    BMET    Component Value Date/Time   NA 138 12/24/2021 0931   NA 139 04/04/2014 0000   K 4.1 12/24/2021 0931   CL 106 12/24/2021 0931   CO2 24 12/24/2021 0931   GLUCOSE 101 (H) 12/24/2021 0931   BUN 23 12/24/2021 0931   CREATININE 0.70 12/24/2021 0931   CREATININE 0.78 06/06/2017 0808   CALCIUM 8.8 12/24/2021 0931   GFRNONAA >60 09/26/2014 2030   GFRAA >60 09/26/2014 2030    BNP No results found for: "BNP"   Imaging:  DG Chest 2 View  Result Date:  04/01/2022 CLINICAL DATA:  Persistent productive cough for 2 weeks. EXAM: CHEST - 2 VIEW COMPARISON:  Right rib radiographs 02/20/2015, chest two views 09/26/2014 FINDINGS: Cardiac silhouette and mediastinal contours are within normal limits. There is an oval structure overlying the lower heart and the upper abdomen and frontal view that appears to be posterior the heart on lateral view, compatible with a moderate sliding hiatal hernia, increased from prior remote comparison radiographs. The lungs are clear. No pleural effusion or pneumothorax. Mild multilevel degenerative disc changes of the thoracic spine. IMPRESSION: Compared to 02/20/2015: 1. No acute cardiopulmonary process. 2. New oval structure compatible with a moderate sliding hiatal hernia centered at the region of the gastroesophageal junction. Electronically Signed   By: Yvonne Kendall M.D.   On: 04/01/2022 14:53         Latest Ref Rng & Units 10/27/2021    8:53 AM  PFT Results  FVC-Pre L 2.94   FVC-Predicted Pre % 98   FVC-Post L 2.82   FVC-Predicted Post % 94   Pre FEV1/FVC % % 82   Post FEV1/FCV % % 86   FEV1-Pre L 2.41   FEV1-Predicted Pre % 104   FEV1-Post L 2.41   DLCO uncorrected ml/min/mmHg 19.15   DLCO UNC% % 104   DLCO corrected ml/min/mmHg 19.15   DLCO COR %Predicted % 104   DLVA Predicted % 112   TLC L 5.64   TLC % Predicted % 122   RV % Predicted % 95     No results found for: "NITRICOXIDE"      Assessment & Plan:   Post-viral cough syndrome Possible URI now with postviral cough and component of upper airway irritation from postnasal drainage. Her FeNO was nl today and lung exam clear. We will treat her with prednisone burst and cough control measures. CXR today without superimposed infection. Check CBC with diff and allergy panel to assess for allergic triggers given her recurrent symptoms.  Patient Instructions  Continue cetirizine (Zyrtec) 1 tab daily Continue montelukast (singulair) 1 tab  daily Continue omeprazole 20 mg daily Continue flonase nasal spray 2 sprays each nostril daily  Amoxicillin 875 mg 1 tab Twice daily. Take with food. Use a daily probiotic or eat yogurt daily while on this Prednisone 20 mg daily for 5 days. Take in AM with food Hycodan 5 mL every 6 hours as  needed for severe coughing. May cause drowsiness. Do not drive after taking Benzonatate 1 capsule Three times a day for cough. Use consistently over the next 4-5 days.  Can use mucinex D or similar over the counter for the next 2-3 days to help with congestion Saline nasal rinses 1-2 times a day follow by flonase 20-30 minutes later   Upper airway cough syndrome: Suppress your cough to allow your larynx (voice box) to heal.  Limit talking for the next few days. Avoid throat clearing. Work on cough suppression with the above recommended suppressants.  Use sugar free hard candies or non-menthol cough drops during this time to soothe your throat.  Warm tea with honey and lemon.   Chest x ray  Labs today   Follow up in 2 weeks with Dr. Erin Fulling or Alanson Aly. If symptoms do not improve or worsen, please contact office for sooner follow up or seek emergency care.    Acute bacterial rhinosinusitis Initial symptoms seem to have been viral vs allergic; however, she has had recent worsening and given timeframe, indication to treat for bacterial coinfection. We will start her on empiric amoxicillin for 7 days. Supportive care measures advised. See above.   I spent 35 minutes of dedicated to the care of this patient on the date of this encounter to include pre-visit review of records, face-to-face time with the patient discussing conditions above, post visit ordering of testing, clinical documentation with the electronic health record, making appropriate referrals as documented, and communicating necessary findings to members of the patients care team.  Clayton Bibles, NP 04/01/2022  Pt aware and understands  NP's role.

## 2022-04-01 NOTE — Assessment & Plan Note (Addendum)
Possible URI now with postviral cough and component of upper airway irritation from postnasal drainage. Her FeNO was nl today and lung exam clear. We will treat her with prednisone burst and cough control measures. CXR today without superimposed infection. Check CBC with diff and allergy panel to assess for allergic triggers given her recurrent symptoms.  Patient Instructions  Continue cetirizine (Zyrtec) 1 tab daily Continue montelukast (singulair) 1 tab daily Continue omeprazole 20 mg daily Continue flonase nasal spray 2 sprays each nostril daily  Amoxicillin 875 mg 1 tab Twice daily. Take with food. Use a daily probiotic or eat yogurt daily while on this Prednisone 20 mg daily for 5 days. Take in AM with food Hycodan 5 mL every 6 hours as needed for severe coughing. May cause drowsiness. Do not drive after taking Benzonatate 1 capsule Three times a day for cough. Use consistently over the next 4-5 days.  Can use mucinex D or similar over the counter for the next 2-3 days to help with congestion Saline nasal rinses 1-2 times a day follow by flonase 20-30 minutes later   Upper airway cough syndrome: Suppress your cough to allow your larynx (voice box) to heal.  Limit talking for the next few days. Avoid throat clearing. Work on cough suppression with the above recommended suppressants.  Use sugar free hard candies or non-menthol cough drops during this time to soothe your throat.  Warm tea with honey and lemon.   Chest x ray  Labs today   Follow up in 2 weeks with Dr. Erin Fulling or Alanson Aly. If symptoms do not improve or worsen, please contact office for sooner follow up or seek emergency care.

## 2022-04-01 NOTE — Telephone Encounter (Signed)
PT calling. Still sick. Cough started to wain but now it is a back even with nurses inst of what to take over the counter. Pls call to advise.   Phlegm, Cough, Hard to sleep, no fever, since over 2 wks.  [phone]  [facility]arm[facility] [place]

## 2022-04-01 NOTE — Progress Notes (Signed)
poc

## 2022-04-01 NOTE — Telephone Encounter (Signed)
Spoke with pt who states cough improved a small amount using OTC but has since came back. Pt was scheduled for acute visit today with Katie Cobb at 2 pm. Nothing further needed at this time.   Routing to [employer] as [employer]

## 2022-04-01 NOTE — Assessment & Plan Note (Signed)
Initial symptoms seem to have been viral vs allergic; however, she has had recent worsening and given timeframe, indication to treat for bacterial coinfection. We will start her on empiric amoxicillin for 7 days. Supportive care measures advised. See above.

## 2022-04-01 NOTE — Patient Instructions (Addendum)
Continue cetirizine (Zyrtec) 1 tab daily Continue montelukast (singulair) 1 tab daily Continue omeprazole 20 mg daily Continue flonase nasal spray 2 sprays each nostril daily  Amoxicillin 875 mg 1 tab Twice daily. Take with food. Use a daily probiotic or eat yogurt daily while on this Prednisone 20 mg daily for 5 days. Take in AM with food Hycodan 5 mL every 6 hours as needed for severe coughing. May cause drowsiness. Do not drive after taking Benzonatate 1 capsule Three times a day for cough. Use consistently over the next 4-5 days.  Can use mucinex D or similar over the counter for the next 2-3 days to help with congestion Saline nasal rinses 1-2 times a day follow by flonase 20-30 minutes later   Upper airway cough syndrome: Suppress your cough to allow your larynx (voice box) to heal.  Limit talking for the next few days. Avoid throat clearing. Work on cough suppression with the above recommended suppressants.  Use sugar free hard candies or non-menthol cough drops during this time to soothe your throat.  Warm tea with honey and lemon.   Chest x ray  Labs today   Follow up in 2 weeks with Dr. Erin Fulling or Alanson Aly. If symptoms do not improve or worsen, please contact office for sooner follow up or seek emergency care.

## 2022-04-02 ENCOUNTER — Other Ambulatory Visit (HOSPITAL_BASED_OUTPATIENT_CLINIC_OR_DEPARTMENT_OTHER): Payer: Self-pay

## 2022-04-02 MED ORDER — HYDROCODONE BIT-HOMATROP MBR 5-1.5 MG/5ML PO SOLN
5.0000 mL | Freq: Four times a day (QID) | ORAL | 0 refills | Status: DC | PRN
  Filled 2022-04-02: qty 240, 12d supply, fill #0

## 2022-04-02 NOTE — Telephone Encounter (Signed)
Katie, please advise if you can send pt's Hycodan to [facility]. I have added it to list of pt's pharmacies.

## 2022-04-02 NOTE — Telephone Encounter (Signed)
Called and spoke with pt letting her know that Rx was sent to [facility] and she verbalized understanding. Nothing further needed.

## 2022-04-02 NOTE — Progress Notes (Signed)
CBC stable. No concerning findings. Thanks.

## 2022-04-02 NOTE — Telephone Encounter (Signed)
PDMP reviewed. Rx sent to [facility]. Thanks.

## 2022-04-03 LAB — ALLERGEN PANEL (27) + IGE
Alternaria Alternata IgE: <0.1 kU/L
Aspergillus Fumigatus IgE: <0.1 kU/L
Bahia Grass IgE: <0.1 kU/L
Bermuda Grass IgE: <0.1 kU/L
Cat Dander IgE: 0.29 kU/L — AB
Cedar, Mountain IgE: <0.1 kU/L
Cladosporium Herbarum IgE: <0.1 kU/L
Cocklebur IgE: <0.1 kU/L
Cockroach, American IgE: <0.1 kU/L
Common Silver Birch IgE: <0.1 kU/L
D Farinae IgE: <0.1 kU/L
D Pteronyssinus IgE: <0.1 kU/L
Dog Dander IgE: 0.18 kU/L — AB
Elm, American IgE: <0.1 kU/L
Hickory, White IgE: 0.16 kU/L — AB
IgE (Immunoglobulin E), Serum: 5 IU/mL — ABNORMAL LOW (ref 6–495)
Johnson Grass IgE: <0.1 kU/L
Kentucky Bluegrass IgE: 0.14 kU/L — AB
Maple/Box Elder IgE: <0.1 kU/L
Mucor Racemosus IgE: <0.1 kU/L
Oak, White IgE: <0.1 kU/L
Penicillium Chrysogen IgE: <0.1 kU/L
Pigweed, Rough IgE: <0.1 kU/L
Plantain, English IgE: <0.1 kU/L
Ragweed, Short IgE: <0.1 kU/L
Setomelanomma Rostrat: <0.1 kU/L
Timothy Grass IgE: <0.1 kU/L
White Mulberry IgE: <0.1 kU/L

## 2022-04-06 NOTE — Progress Notes (Signed)
Allergen panel with low positives to cat dander, dog dander, [employer], and white [place]. No change to current regimen.

## 2022-04-07 ENCOUNTER — Telehealth: Payer: Self-pay | Admitting: Pulmonary Disease

## 2022-04-07 NOTE — Progress Notes (Signed)
ATC x1.  LVM to return call. 

## 2022-04-07 NOTE — Telephone Encounter (Signed)
Patient states she has almost finished antibiotic and steroid. States she is still coughing and feeling congested. Wants to know should she be on something stronger?   Katie please advise   [facility] [place]

## 2022-04-08 NOTE — Telephone Encounter (Signed)
See phone encounter from 04/07/22.

## 2022-04-08 NOTE — Telephone Encounter (Signed)
Patient had sent the following mychart message earlier today:   "I have finished the steroid and took the last antibiotic today.   I thought I was starting to feel better yesterday; however, I still coughed a lot last night.   I do not feel good at all today.   Do you think I may need a stronger antibiotic?   Thank you, Rachel Fitzpatrick"

## 2022-04-08 NOTE — Telephone Encounter (Signed)
Are her symptoms at all improved? Is she using the benzonatate and cough syrup for her cough? Any new fevers or SOB?

## 2022-04-08 NOTE — Telephone Encounter (Signed)
Called patient, she did not answer. Left message for her to call back.

## 2022-04-08 NOTE — Telephone Encounter (Signed)
There was no evidence of infection on her recent chest x ray so I don't think we need to change her abx right now. We can give her a longer course of steroids. Please send prednisone 10 mg tablets - 4 tabs for 2 days, then 3 tabs for 2 days, 2 tabs for 2 days, then 1 tab for 2 days, then stop. Take in AM with food. Use benzonatate Three times a day until cough resolves. She can use hycodan or delsym OTC as well to work on cough suppression. If no improvement, needs sooner f/u. Thanks.

## 2022-04-09 ENCOUNTER — Telehealth: Payer: Self-pay | Admitting: Pulmonary Disease

## 2022-04-09 MED ORDER — PREDNISONE 10 MG PO TABS: ORAL_TABLET | ORAL | 0 refills | Status: AC

## 2022-04-09 NOTE — Telephone Encounter (Signed)
Called and spoke with patient. She stated that she forgot to mention earlier that she has been seeing small amounts of bright red blood in her nasal discharge and phlegm. She is seeing the blood after either blowing her nose or after a coughing fit. I advised her that it was mostly irritation from blowing her nose and coughing but that I would let Katie know.   Joellen Jersey, can you please advise? Thanks.

## 2022-04-09 NOTE — Telephone Encounter (Signed)
Yes, likely from irritation. Would recommend she try to limit how often she is blowing her nose and do so gently. Continue to work on suppressing her cough. She can use some saline nasal gel 1-2 times a day to help moisturize her nasal passages. May need to decrease flonase to 1 spray daily. If this gets worse or she develops worsening respiratory symptoms including increased shortness of breath, chest discomfort, low oxygen levels, she needs to go to the ED. Thanks.

## 2022-04-09 NOTE — Telephone Encounter (Signed)
Called and spoke with pt letting her know recs per [facility] and she verbalized understanding. Rx sent to preferred pharmacy for pt. Nothing further needed.

## 2022-04-09 NOTE — Telephone Encounter (Signed)
Spoke with patient advised of Katie's recommendations. She verbalized understanding. Nothing further needed.

## 2022-04-15 ENCOUNTER — Ambulatory Visit: Payer: BLUE CROSS/BLUE SHIELD | Admitting: Pulmonary Disease

## 2022-04-15 ENCOUNTER — Encounter: Payer: Self-pay | Admitting: Pulmonary Disease

## 2022-04-15 VITALS — BP 126/80 | HR 87 | Ht 61.0 in | Wt 169.8 lb

## 2022-04-15 DIAGNOSIS — R058 Other specified cough: Secondary | ICD-10-CM | POA: Diagnosis not present

## 2022-04-15 DIAGNOSIS — R0982 Postnasal drip: Secondary | ICD-10-CM

## 2022-04-15 MED ORDER — IPRATROPIUM BROMIDE 0.03 % NA SOLN: 2.0000 | Freq: Two times a day (BID) | NASAL | 12 refills | Status: DC

## 2022-04-15 MED ORDER — MONTELUKAST SODIUM 10 MG PO TABS: 10.0000 mg | ORAL_TABLET | Freq: Every day | ORAL | 3 refills | Status: DC

## 2022-04-15 NOTE — Patient Instructions (Addendum)
Start mucinex DM 1,272m twice daily  Continue flonase nasal spray, 1 spray per nostril daily  Continue montelukast 160mdaily  Continue zyrtec daily  Continue prednisone taper  Use hycodan cough syrup as needed at night  Start ipratropium nasal spray, 2 sprays per nostril twice daily - use for 1 week scheduled and then as needed.   Follow up as needed

## 2022-04-15 NOTE — Progress Notes (Signed)
Synopsis: Referred in May 2025 for Chronic Bronchitis by Annye Asa, MD  Subjective:   PATIENT ID: Rachel Fitzpatrick GENDER: female DOB: May 09, 1960, MRN: [id number]  HPI  Chief Complaint  Patient presents with   Follow-up    Has improved but still has congestion and cough   Rachel Fitzpatrick is a 62 year old female, never smoker with history of GERD who returns to pulmonary clinic for chronic bronchitis.   She saw Roxan Diesel, NP 04/01/22 was given amoxicillin and prednisone. She was sent in a second round of prednisone on 2/7, extended taper. Some pressure of her temples and tenderness above eyes. No fevers or chills. Overall feels better than earlier this month but is having cough and post-nasal drainage.  OV 10/27/21 She reports feeling well since last visit. She denies cough or issues with her breathing. She continues on omprazole 29m daily and started elevating the head of her bed. She tries to eat 2 hours before bedtime as often as possible.   PFTs are within normal limits.   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 She has a dry cough and is constantly clearing her throat throughout the day. She has increased cough, shortness of breath and wheezing when she has a cold and now she has these symptoms during allergy season, especially in the spring. She is taking zyrtec, singulair and flonase which help. She was prescribed advair and only tried it shortly because it made her feel funny. She does report issues with GERD. She does tend to eat late dinners. She has hiatal hernia noted on chest x-ray.  She is a never smoker. She grew up with second hand smoke exposure. She currently works from home. She has gained a significant amount of weight over recent years. She has 2 dogs at home. She enjoys gardening.   Her mother had lung cancer as well  as her maternal uncles. Her brother has lung cancer.   Past Medical History:  Diagnosis Date   Adenomatous colon polyp    Allergy    Anemia    Anxiety    Arthritis    Blood in stool    Chronic bronchitis (HCC)    Complication of anesthesia    Reports hypotension with anesthesia   COVID-19 03/2019   Depression    GERD (gastroesophageal reflux disease)    History of hiatal hernia    Hypercholesteremia    Osteopenia      Family History  Problem Relation Age of Onset   Hyperlipidemia Mother    Cancer Mother        lung cancer   Hyperlipidemia Father    Diabetes Father    COPD Father    Sudden death Neg Hx    Heart attack Neg Hx    Hypertension Neg Hx    Colon cancer Neg Hx    Esophageal cancer Neg Hx    Rectal cancer Neg Hx    Stomach cancer Neg Hx      Social History   Socioeconomic History   Marital status: Married    Spouse name: Not on file   Number of children: Not on file   Years of education: Not on file   Highest education level: Not on file  Occupational History   Not on file  Tobacco Use   Smoking status: Never  Passive exposure: Past   Smokeless tobacco: Never  Vaping Use   Vaping Use: Never used  Substance and Sexual Activity   Alcohol use: Yes    Comment: Weekends; 2-3 beverages; Social.   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 TAKE WITH A FULL GLASS OF WATER ON AN EMPTY STOMACH. 12 tablet 7   benzonatate (TESSALON) 200 MG capsule Take 1 capsule (200 mg total) by mouth 3 (three) times daily as needed for cough. 30 capsule 1   Calcium Citrate-Vitamin D (CALCIUM + D PO) Take 1 tablet by mouth 2 (two) times daily.     cetirizine (ZYRTEC) 10 MG tablet Take 1 tablet (10 mg total) by mouth daily. (Patient taking differently: Take 10 mg by mouth daily as needed for allergies.) 30 tablet 11   fluticasone (FLONASE) 50 MCG/ACT nasal spray Place 1 spray into both nostrils daily.     HYDROcodone bit-homatropine (HYCODAN) 5-1.5 MG/5ML syrup Take 5 mLs by mouth every 6 (six) hours as needed for cough. 240 mL 0   omeprazole (PRILOSEC) 20 MG capsule Take 20 mg by mouth daily as needed (heartburn).     predniSONE (DELTASONE) 10 MG tablet Take 4 tablets (40 mg total) by mouth daily with breakfast for 2 days, THEN 3 tablets (30 mg total) daily with breakfast for 2 days, THEN 2 tablets (20 mg total) daily with breakfast for 2 days, THEN 1 tablet (10 mg total) daily with breakfast for 2 days. 20 tablet 0   rosuvastatin (CRESTOR) 20 MG tablet Take 1 tablet by mouth daily.     sertraline (ZOLOFT) 100 MG tablet Take 2 tablets by mouth daily.     montelukast (SINGULAIR) 10 MG tablet Take 10 mg by mouth at bedtime.     montelukast (SINGULAIR) 10 MG tablet Take 1 tablet by mouth at bedtime.     No facility-administered medications prior to visit.   Review of Systems  Constitutional:  Negative for chills, fever, malaise/fatigue and weight loss.  HENT:  Positive for congestion. Negative for sinus pain and sore throat.   Eyes: Negative.   Respiratory:  Positive for cough. Negative for hemoptysis, sputum production, shortness of breath and wheezing.   Cardiovascular:  Negative for chest pain, palpitations, orthopnea, claudication and leg swelling.   Gastrointestinal:  Negative for abdominal pain, heartburn, nausea and vomiting.  Genitourinary: Negative.   Musculoskeletal:  Negative for joint pain and myalgias.  Skin:  Negative for rash.  Neurological:  Negative for weakness.  Endo/Heme/Allergies: Negative.   Psychiatric/Behavioral: Negative.      Objective:   Vitals:   04/15/22 0956  BP: 126/80  Pulse: 87  SpO2: 97%  Weight: 169 lb 12.8 oz (77  kg)  Height: 5' 1"$  (1.549 m)   Physical Exam Constitutional:      General: She is not in acute distress.    Appearance: She is not ill-appearing.  HENT:     Head: Normocephalic and atraumatic.     Nose:     Right Sinus: Frontal sinus tenderness present.     Left Sinus: Frontal sinus tenderness present.  Eyes:     General: No scleral icterus. Cardiovascular:     Rate and Rhythm: Normal rate and regular rhythm.     Pulses: Normal pulses.     Heart sounds: Normal heart sounds. No murmur heard. Pulmonary:     Effort: Pulmonary effort is normal.     Breath sounds: Normal breath sounds. No wheezing, rhonchi or rales.  Musculoskeletal:     Right lower leg: No edema.     Left lower leg: No edema.  Skin:    General: Skin is warm and dry.  Neurological:     General: No focal deficit present.     Mental Status: She is alert.    CBC    Component Value Date/Time   WBC 6.4 04/01/2022 1518   RBC 3.98 04/01/2022 1518   HGB 12.0 04/01/2022 1518   HCT 35.3 (L) 04/01/2022 1518   PLT 219.0 04/01/2022 1518   MCV 88.7 04/01/2022 1518   MCH 31.0 06/06/2017 0808   MCHC 34.1 04/01/2022 1518   RDW 13.3 04/01/2022 1518   LYMPHSABS 1.7 04/01/2022 1518   MONOABS 0.4 04/01/2022 1518   EOSABS 0.1 04/01/2022 1518   BASOSABS 0.0 04/01/2022 1518      Latest Ref Rng & Units 12/24/2021    9:31 AM 06/22/2021    8:33 AM 12/19/2020    9:46 AM  BMP  Glucose 70 - 99 mg/dL 101  88  102   BUN 6 - 23 mg/dL 23  26  24   $ Creatinine 0.40 - 1.20 mg/dL 0.70  0.73  0.76   Sodium 135 - 145 mEq/L 138   137  138   Potassium 3.5 - 5.1 mEq/L 4.1  4.4  4.2   Chloride 96 - 112 mEq/L 106  102  103   CO2 19 - 32 mEq/L 24  25  26   $ Calcium 8.4 - 10.5 mg/dL 8.8  9.2  9.5    Chest imaging: CXR 06/10/20 The lungs are adequately aerated without focal opacities.  No pleural abnormalities.  The cardiac silhouette is normal size. The visualized osseous structures are unremarkable.  Hiatal hernia.  PFT:    Latest Ref Rng & Units 10/27/2021    8:53 AM  PFT Results  FVC-Pre L 2.94   FVC-Predicted Pre % 98   FVC-Post L 2.82   FVC-Predicted Post % 94   Pre FEV1/FVC % % 82   Post FEV1/FCV % % 86   FEV1-Pre L 2.41   FEV1-Predicted Pre % 104   FEV1-Post L 2.41   DLCO uncorrected ml/min/mmHg 19.15   DLCO UNC% % 104   DLCO corrected ml/min/mmHg 19.15   DLCO COR %Predicted % 104   DLVA Predicted % 112   TLC L 5.64   TLC % Predicted % 122   RV % Predicted % 95     Labs:  Path:  Echo:  Heart Catheterization:  Assessment & Plan:   Post-viral cough syndrome  Post-nasal drainage - Plan: ipratropium (ATROVENT) 0.03 % nasal spray  Discussion: Hli Rent is a 62 year old female, never smoker with history  of GERD who returns to pulmonary clinic for chronic bronchitis.   She has post-viral cough syndrome and sinusitis. She was treated with amoxicillin and feels her sinus issues are improving.  She is to complete extended steroid taper. Start mucinex DM as needed for congestion.   She is to continue flonase 1 spray per nostril daily. She is to start ipratropium nasal spray, 2 sprays per nostril twice daily.   Continue zyrtec and montelukast.  Follow up as needed.   Freda Jackson, MD [facility] Office: [phone]   Current Outpatient Medications:    alendronate (FOSAMAX) 70 MG tablet, TAKE 1 TABLET BY MOUTH EVERY 7 DAYS. TAKE WITH A FULL GLASS OF WATER ON AN EMPTY STOMACH., Disp: 12 tablet, Rfl: 7   benzonatate (TESSALON) 200 MG capsule, Take 1 capsule (200  mg total) by mouth 3 (three) times daily as needed for cough., Disp: 30 capsule, Rfl: 1   Calcium Citrate-Vitamin D (CALCIUM + D PO), Take 1 tablet by mouth 2 (two) times daily., Disp: , Rfl:    cetirizine (ZYRTEC) 10 MG tablet, Take 1 tablet (10 mg total) by mouth daily. (Patient taking differently: Take 10 mg by mouth daily as needed for allergies.), Disp: 30 tablet, Rfl: 11   fluticasone (FLONASE) 50 MCG/ACT nasal spray, Place 1 spray into both nostrils daily., Disp: , Rfl:    HYDROcodone bit-homatropine (HYCODAN) 5-1.5 MG/5ML syrup, Take 5 mLs by mouth every 6 (six) hours as needed for cough., Disp: 240 mL, Rfl: 0   ipratropium (ATROVENT) 0.03 % nasal spray, Place 2 sprays into both nostrils every 12 (twelve) hours., Disp: 30 mL, Rfl: 12   omeprazole (PRILOSEC) 20 MG capsule, Take 20 mg by mouth daily as needed (heartburn)., Disp: , Rfl:    predniSONE (DELTASONE) 10 MG tablet, Take 4 tablets (40 mg total) by mouth daily with breakfast for 2 days, THEN 3 tablets (30 mg total) daily with breakfast for 2 days, THEN 2 tablets (20 mg total) daily with breakfast for 2 days, THEN 1 tablet (10 mg total) daily with breakfast for 2 days., Disp: 20 tablet, Rfl: 0   rosuvastatin (CRESTOR) 20 MG tablet, Take 1 tablet by mouth daily., Disp: , Rfl:    sertraline (ZOLOFT) 100 MG tablet, Take 2 tablets by mouth daily., Disp: , Rfl:    montelukast (SINGULAIR) 10 MG tablet, Take 1 tablet (10 mg total) by mouth at bedtime., Disp: 30 tablet, Rfl: 3

## 2022-06-25 ENCOUNTER — Encounter: Payer: Managed Care, Other (non HMO) | Admitting: Family Medicine

## 2022-07-01 ENCOUNTER — Encounter: Payer: Managed Care, Other (non HMO) | Admitting: Family Medicine

## 2022-07-11 ENCOUNTER — Other Ambulatory Visit: Payer: Self-pay | Admitting: Pulmonary Disease

## 2022-08-02 ENCOUNTER — Ambulatory Visit (INDEPENDENT_AMBULATORY_CARE_PROVIDER_SITE_OTHER): Payer: BLUE CROSS/BLUE SHIELD | Admitting: Family Medicine

## 2022-08-02 ENCOUNTER — Encounter: Payer: Self-pay | Admitting: Family Medicine

## 2022-08-02 VITALS — BP 108/80 | HR 81 | Temp 98.0°F | Resp 17 | Ht 61.0 in | Wt 157.2 lb

## 2022-08-02 DIAGNOSIS — E785 Hyperlipidemia, unspecified: Secondary | ICD-10-CM

## 2022-08-02 DIAGNOSIS — Z0000 Encounter for general adult medical examination without abnormal findings: Secondary | ICD-10-CM

## 2022-08-02 DIAGNOSIS — M810 Age-related osteoporosis without current pathological fracture: Secondary | ICD-10-CM

## 2022-08-02 LAB — CBC WITH DIFFERENTIAL/PLATELET
Basophils Absolute: 0 10*3/uL (ref 0.0–0.1)
Basophils Relative: 0.3 % (ref 0.0–3.0)
Eosinophils Absolute: 0.1 10*3/uL (ref 0.0–0.7)
Eosinophils Relative: 1.5 % (ref 0.0–5.0)
HCT: 37.7 % (ref 36.0–46.0)
Hemoglobin: 12.6 g/dL (ref 12.0–15.0)
Lymphocytes Relative: 40.3 % (ref 12.0–46.0)
Lymphs Abs: 1.4 10*3/uL (ref 0.7–4.0)
MCHC: 33.4 g/dL (ref 30.0–36.0)
MCV: 89.5 fl (ref 78.0–100.0)
Monocytes Absolute: 0.3 10*3/uL (ref 0.1–1.0)
Monocytes Relative: 7.1 % (ref 3.0–12.0)
Neutro Abs: 1.8 10*3/uL (ref 1.4–7.7)
Neutrophils Relative %: 50.8 % (ref 43.0–77.0)
Platelets: 176 10*3/uL (ref 150.0–400.0)
RBC: 4.21 Mil/uL (ref 3.87–5.11)
RDW: 13.5 % (ref 11.5–15.5)
WBC: 3.6 10*3/uL — ABNORMAL LOW (ref 4.0–10.5)

## 2022-08-02 LAB — LIPID PANEL
Cholesterol: 226 mg/dL — ABNORMAL HIGH (ref 0–200)
HDL: 97.8 mg/dL (ref >39.00)
LDL Cholesterol: 118 mg/dL — ABNORMAL HIGH (ref 0–99)
NonHDL: 128.38
Total CHOL/HDL Ratio: 2
Triglycerides: 52 mg/dL (ref 0.0–149.0)
VLDL: 10.4 mg/dL (ref 0.0–40.0)

## 2022-08-02 LAB — BASIC METABOLIC PANEL
BUN: 25 mg/dL — ABNORMAL HIGH (ref 6–23)
CO2: 25 mEq/L (ref 19–32)
Calcium: 9.1 mg/dL (ref 8.4–10.5)
Chloride: 102 mEq/L (ref 96–112)
Creatinine, Ser: 0.7 mg/dL (ref 0.40–1.20)
GFR: 93.94 mL/min (ref >60.00)
Glucose, Bld: 88 mg/dL (ref 70–99)
Potassium: 4 mEq/L (ref 3.5–5.1)
Sodium: 138 mEq/L (ref 135–145)

## 2022-08-02 LAB — TSH: TSH: 0.95 u[IU]/mL (ref 0.35–5.50)

## 2022-08-02 LAB — HEPATIC FUNCTION PANEL
ALT: 14 U/L (ref 0–35)
AST: 31 U/L (ref 0–37)
Albumin: 4.5 g/dL (ref 3.5–5.2)
Alkaline Phosphatase: 44 U/L (ref 39–117)
Bilirubin, Direct: 0.1 mg/dL (ref 0.0–0.3)
Total Bilirubin: 0.3 mg/dL (ref 0.2–1.2)
Total Protein: 7.3 g/dL (ref 6.0–8.3)

## 2022-08-02 LAB — VITAMIN D 25 HYDROXY (VIT D DEFICIENCY, FRACTURES): VITD: 48.41 ng/mL (ref 30.00–100.00)

## 2022-08-02 NOTE — Assessment & Plan Note (Signed)
Pt's PE WNL.  UTD on colonoscopy.  Due for pap and mammo- pt to schedule.  Check labs.  Anticipatory guidance provided.

## 2022-08-02 NOTE — Progress Notes (Signed)
   Subjective:    Patient ID: Rachel Fitzpatrick, female    DOB: [birth year], 61 y.o.   MRN: [id number]  HPI CPE- UTD on pap, colonoscopy, Tdap, shingrix  Patient Care Team    Relationship Specialty Notifications Start End  Sheliah Hatch, MD PCP - General Family Medicine  05/08/14   Richarda Overlie, MD Consulting Physician Obstetrics and Gynecology  06/10/14      Health Maintenance  Topic Date Due   MAMMOGRAM  01/01/2022   PAP SMEAR-Modifier  08/30/2022   INFLUENZA VACCINE  09/30/2022   Colonoscopy  07/31/2026   DTaP/Tdap/Td (3 - Td or Tdap) 06/21/2030   Hepatitis C Screening  Completed   HIV Screening  Completed   Zoster Vaccines- Shingrix  Completed   HPV VACCINES  Aged Out   COVID-19 Vaccine  Discontinued     Review of Systems Patient reports no vision/ hearing changes, adenopathy,fever,  persistant/recurrent hoarseness , swallowing issues, chest pain, palpitations, edema, persistant/recurrent cough, hemoptysis, dyspnea (rest/exertional/paroxysmal nocturnal), gastrointestinal bleeding (melena, rectal bleeding), abdominal pain, significant heartburn, bowel changes, GU symptoms (dysuria, hematuria, incontinence), Gyn symptoms (abnormal  bleeding, pain),  syncope, focal weakness, memory loss, numbness & tingling, skin/hair/nail changes, abnormal bruising or bleeding, anxiety, or depression.   + 13 lb weight loss- pt reports feeling so much better     Objective:   Physical Exam General Appearance:    Alert, cooperative, no distress, appears stated age  Head:    Normocephalic, without obvious abnormality, atraumatic  Eyes:    PERRL, conjunctiva/corneas clear, EOM's intact both eyes  Ears:    Normal TM's and external ear canals, both ears  Nose:   Nares normal, septum midline, mucosa normal, no drainage    or sinus tenderness  Throat:   Lips, mucosa, and tongue normal; teeth and gums normal  Neck:   Supple, symmetrical, trachea midline, no adenopathy;    Thyroid: no  enlargement/tenderness/nodules  Back:     Symmetric, no curvature, ROM normal, no CVA tenderness  Lungs:     Clear to auscultation bilaterally, respirations unlabored  Chest Wall:    No tenderness or deformity   Heart:    Regular rate and rhythm, S1 and S2 normal, no murmur, rub   or gallop  Breast Exam:    Deferred to GYN  Abdomen:     Soft, non-tender, bowel sounds active all four quadrants,    no masses, no organomegaly  Genitalia:    Deferred to GYN  Rectal:    Extremities:   Extremities normal, atraumatic, no cyanosis or edema  Pulses:   2+ and symmetric all extremities  Skin:   Skin color, texture, turgor normal, no rashes or lesions  Lymph nodes:   Cervical, supraclavicular, and axillary nodes normal  Neurologic:   CNII-XII intact, normal strength, sensation and reflexes    throughout          Assessment & Plan:

## 2022-08-02 NOTE — Patient Instructions (Signed)
Follow up in 6 months to recheck cholesterol and weight loss progress We'll notify you of your lab results and make any changes if needed Keep up the good work on healthy diet and regular exercise- you look great!!! We'll call you to schedule your calcium score CT Schedule pap and mammo at your convenience and have them send me a copy of the results Call with any questions or concerns Stay Safe!  Stay Healthy! CONGRATS!  You look great!!

## 2022-08-02 NOTE — Assessment & Plan Note (Signed)
Chronic problem.  Check labs as pt on statin and has lost 13 lbs.  Will also get Calcium score and adjust statin tx prn.

## 2022-08-02 NOTE — Assessment & Plan Note (Signed)
Ongoing issue.  Check Vit D and replete prn. 

## 2022-08-03 ENCOUNTER — Telehealth: Payer: Self-pay

## 2022-08-03 NOTE — Telephone Encounter (Signed)
Pt aware of lab results 

## 2022-08-03 NOTE — Telephone Encounter (Signed)
-----   Message from Sheliah Hatch, MD sent at 08/03/2022  7:25 AM EDT ----- Labs are stable and look great!  No changes at this time

## 2022-08-16 DIAGNOSIS — M9901 Segmental and somatic dysfunction of cervical region: Secondary | ICD-10-CM | POA: Diagnosis not present

## 2022-08-16 DIAGNOSIS — M9902 Segmental and somatic dysfunction of thoracic region: Secondary | ICD-10-CM | POA: Diagnosis not present

## 2022-08-16 DIAGNOSIS — M531 Cervicobrachial syndrome: Secondary | ICD-10-CM | POA: Diagnosis not present

## 2022-08-16 DIAGNOSIS — M5032 Other cervical disc degeneration, mid-cervical region, unspecified level: Secondary | ICD-10-CM | POA: Diagnosis not present

## 2022-08-16 DIAGNOSIS — M9904 Segmental and somatic dysfunction of sacral region: Secondary | ICD-10-CM | POA: Diagnosis not present

## 2022-08-16 DIAGNOSIS — M9903 Segmental and somatic dysfunction of lumbar region: Secondary | ICD-10-CM | POA: Diagnosis not present

## 2022-08-19 DIAGNOSIS — M5032 Other cervical disc degeneration, mid-cervical region, unspecified level: Secondary | ICD-10-CM | POA: Diagnosis not present

## 2022-08-19 DIAGNOSIS — M9902 Segmental and somatic dysfunction of thoracic region: Secondary | ICD-10-CM | POA: Diagnosis not present

## 2022-08-19 DIAGNOSIS — M9901 Segmental and somatic dysfunction of cervical region: Secondary | ICD-10-CM | POA: Diagnosis not present

## 2022-08-19 DIAGNOSIS — M531 Cervicobrachial syndrome: Secondary | ICD-10-CM | POA: Diagnosis not present

## 2022-08-24 DIAGNOSIS — M531 Cervicobrachial syndrome: Secondary | ICD-10-CM | POA: Diagnosis not present

## 2022-08-24 DIAGNOSIS — M5032 Other cervical disc degeneration, mid-cervical region, unspecified level: Secondary | ICD-10-CM | POA: Diagnosis not present

## 2022-08-24 DIAGNOSIS — M9902 Segmental and somatic dysfunction of thoracic region: Secondary | ICD-10-CM | POA: Diagnosis not present

## 2022-08-24 DIAGNOSIS — M9901 Segmental and somatic dysfunction of cervical region: Secondary | ICD-10-CM | POA: Diagnosis not present

## 2022-09-05 ENCOUNTER — Other Ambulatory Visit: Payer: Self-pay | Admitting: Family Medicine

## 2022-09-07 ENCOUNTER — Telehealth: Payer: Self-pay

## 2022-09-07 NOTE — Telephone Encounter (Signed)
Pt has a pap and mammogram schedule for 11/24/22 w/ Dr Marcelle Overlie

## 2022-09-08 ENCOUNTER — Ambulatory Visit (HOSPITAL_COMMUNITY)
Admission: RE | Admit: 2022-09-08 | Discharge: 2022-09-08 | Disposition: A | Discharge status: Home / Self Care | Payer: Self-pay | Source: Ambulatory Visit | Attending: Family Medicine | Admitting: Family Medicine

## 2022-09-08 DIAGNOSIS — E785 Hyperlipidemia, unspecified: Secondary | ICD-10-CM | POA: Insufficient documentation

## 2022-09-09 ENCOUNTER — Telehealth: Payer: Self-pay

## 2022-09-09 NOTE — Telephone Encounter (Signed)
Pt is aware of lab results.

## 2022-09-09 NOTE — Telephone Encounter (Signed)
-----   Message from Neena Rhymes sent at 09/09/2022  7:27 AM EDT ----- Your calcium score is 0- it doesn't get better than that!!!

## 2022-11-10 ENCOUNTER — Other Ambulatory Visit: Payer: Self-pay | Admitting: Family Medicine

## 2022-11-24 DIAGNOSIS — Z1231 Encounter for screening mammogram for malignant neoplasm of breast: Secondary | ICD-10-CM | POA: Diagnosis not present

## 2022-11-24 DIAGNOSIS — Z6827 Body mass index (BMI) 27.0-27.9, adult: Secondary | ICD-10-CM | POA: Diagnosis not present

## 2022-11-24 DIAGNOSIS — Z124 Encounter for screening for malignant neoplasm of cervix: Secondary | ICD-10-CM | POA: Diagnosis not present

## 2022-11-24 DIAGNOSIS — Z1151 Encounter for screening for human papillomavirus (HPV): Secondary | ICD-10-CM | POA: Diagnosis not present

## 2022-11-24 DIAGNOSIS — Z01419 Encounter for gynecological examination (general) (routine) without abnormal findings: Secondary | ICD-10-CM | POA: Diagnosis not present

## 2022-12-19 DIAGNOSIS — M25531 Pain in right wrist: Secondary | ICD-10-CM | POA: Diagnosis not present

## 2022-12-28 DIAGNOSIS — J209 Acute bronchitis, unspecified: Secondary | ICD-10-CM | POA: Diagnosis not present

## 2022-12-29 ENCOUNTER — Ambulatory Visit: Payer: BC Managed Care – PPO | Admitting: Family Medicine

## 2023-01-30 ENCOUNTER — Other Ambulatory Visit: Payer: Self-pay | Admitting: Family Medicine

## 2023-01-31 NOTE — Telephone Encounter (Signed)
Requested Prescriptions   Pending Prescriptions Disp Refills   sertraline (ZOLOFT) 100 MG tablet [Pharmacy Med Name: SERTRALINE HCL 100 MG TABLET] 180 tablet 1    Sig: TAKE 2 TABLETS BY MOUTH EVERY DAY     Date of patient request: 03/02/2022 Last office visit: 08/02/2022 Upcoming visit: 02/01/2023 Date of last refill: N/A Last refill amount: N/A  Medication is in her med list but is listed as historical and wasn't sent by you.

## 2023-02-01 ENCOUNTER — Encounter: Payer: Self-pay | Admitting: Family Medicine

## 2023-02-01 ENCOUNTER — Ambulatory Visit: Payer: BC Managed Care – PPO | Admitting: Family Medicine

## 2023-02-01 VITALS — BP 118/62 | HR 71 | Temp 98.9°F | Ht 61.0 in | Wt 148.1 lb

## 2023-02-01 DIAGNOSIS — E785 Hyperlipidemia, unspecified: Secondary | ICD-10-CM | POA: Diagnosis not present

## 2023-02-01 DIAGNOSIS — E663 Overweight: Secondary | ICD-10-CM | POA: Insufficient documentation

## 2023-02-01 LAB — CBC WITH DIFFERENTIAL/PLATELET
Basophils Absolute: 0 10*3/uL (ref 0.0–0.1)
Basophils Relative: 0.4 % (ref 0.0–3.0)
Eosinophils Absolute: 0.1 10*3/uL (ref 0.0–0.7)
Eosinophils Relative: 1.6 % (ref 0.0–5.0)
HCT: 39.7 % (ref 36.0–46.0)
Hemoglobin: 13.6 g/dL (ref 12.0–15.0)
Lymphocytes Relative: 39.1 % (ref 12.0–46.0)
Lymphs Abs: 1.8 10*3/uL (ref 0.7–4.0)
MCHC: 34.2 g/dL (ref 30.0–36.0)
MCV: 93.3 fL (ref 78.0–100.0)
Monocytes Absolute: 0.3 10*3/uL (ref 0.1–1.0)
Monocytes Relative: 6.2 % (ref 3.0–12.0)
Neutro Abs: 2.4 10*3/uL (ref 1.4–7.7)
Neutrophils Relative %: 52.7 % (ref 43.0–77.0)
Platelets: 175 10*3/uL (ref 150.0–400.0)
RBC: 4.25 Mil/uL (ref 3.87–5.11)
RDW: 13.1 % (ref 11.5–15.5)
WBC: 4.6 10*3/uL (ref 4.0–10.5)

## 2023-02-01 LAB — HEPATIC FUNCTION PANEL
ALT: 13 U/L (ref 0–35)
AST: 26 U/L (ref 0–37)
Albumin: 4.4 g/dL (ref 3.5–5.2)
Alkaline Phosphatase: 56 U/L (ref 39–117)
Bilirubin, Direct: 0.1 mg/dL (ref 0.0–0.3)
Total Bilirubin: 0.6 mg/dL (ref 0.2–1.2)
Total Protein: 7.3 g/dL (ref 6.0–8.3)

## 2023-02-01 LAB — LIPID PANEL
Cholesterol: 279 mg/dL — ABNORMAL HIGH (ref 0–200)
HDL: 117.1 mg/dL (ref >39.00)
LDL Cholesterol: 145 mg/dL — ABNORMAL HIGH (ref 0–99)
NonHDL: 162.3
Total CHOL/HDL Ratio: 2
Triglycerides: 86 mg/dL (ref 0.0–149.0)
VLDL: 17.2 mg/dL (ref 0.0–40.0)

## 2023-02-01 LAB — BASIC METABOLIC PANEL
BUN: 29 mg/dL — ABNORMAL HIGH (ref 6–23)
CO2: 26 meq/L (ref 19–32)
Calcium: 9 mg/dL (ref 8.4–10.5)
Chloride: 104 meq/L (ref 96–112)
Creatinine, Ser: 0.64 mg/dL (ref 0.40–1.20)
GFR: 95.65 mL/min (ref >60.00)
Glucose, Bld: 101 mg/dL — ABNORMAL HIGH (ref 70–99)
Potassium: 4.1 meq/L (ref 3.5–5.1)
Sodium: 137 meq/L (ref 135–145)

## 2023-02-01 LAB — TSH: TSH: 1.38 u[IU]/mL (ref 0.35–5.50)

## 2023-02-01 NOTE — Assessment & Plan Note (Signed)
Improving.  Pt is down 9 lbs since last visit and 25 lbs overall.  Applauded her efforts.  Encouraged her to get back to regular exercise.  Will follow.

## 2023-02-01 NOTE — Patient Instructions (Signed)
Schedule your complete physical in 6 months We'll notify you of your lab results and make any changes if needed Continue to work on healthy diet and regular exercise- you look great! Call with any questions or concerns Stay Safe!  Stay Healthy! Happy Holidays!!! 

## 2023-02-01 NOTE — Assessment & Plan Note (Signed)
Chronic problem.  On Crestor 20mg daily w/o difficulty.  Check labs.  Adjust meds prn  

## 2023-02-01 NOTE — Progress Notes (Signed)
   Subjective:    Patient ID: Rachel Fitzpatrick, female    DOB: 05/24/1960, 62 y.o.   MRN: [id number]  HPI Hyperlipidemia- chronic problem, on Crestor 20mg  daily.  Denies CP, SOB, abd pain, N/V.  Overweight- pt is down 9 lbs since last visit.  Went to [facility] and overall lost 25 lbs.  She is maintaining and not currently on medication.  Is not exercising regularly but plans to resume.   Review of Systems For ROS see HPI     Objective:   Physical Exam Vitals reviewed.  Constitutional:      General: She is not in acute distress.    Appearance: Normal appearance. She is well-developed.  HENT:     Head: Normocephalic and atraumatic.  Eyes:     Conjunctiva/sclera: Conjunctivae normal.     Pupils: Pupils are equal, round, and reactive to light.  Neck:     Thyroid: No thyromegaly.  Cardiovascular:     Rate and Rhythm: Normal rate and regular rhythm.     Pulses: Normal pulses.     Heart sounds: Normal heart sounds. No murmur heard. Pulmonary:     Effort: Pulmonary effort is normal. No respiratory distress.     Breath sounds: Normal breath sounds.  Abdominal:     General: There is no distension.     Palpations: Abdomen is soft.     Tenderness: There is no abdominal tenderness.  Musculoskeletal:     Cervical back: Normal range of motion and neck supple.     Right lower leg: No edema.     Left lower leg: No edema.  Lymphadenopathy:     Cervical: No cervical adenopathy.  Skin:    General: Skin is warm and dry.  Neurological:     General: No focal deficit present.     Mental Status: She is alert and oriented to person, place, and time.  Psychiatric:        Mood and Affect: Mood normal.        Behavior: Behavior normal.        Thought Content: Thought content normal.           Assessment & Plan:

## 2023-02-02 ENCOUNTER — Telehealth: Payer: Self-pay

## 2023-02-02 NOTE — Telephone Encounter (Signed)
Pt has been notified and has no concerns

## 2023-02-02 NOTE — Telephone Encounter (Signed)
-----   Message from Neena Rhymes sent at 02/02/2023  7:25 AM EST ----- Your total cholesterol and your bad cholesterol (LDL) have both jumped considerably since last check.  Please make sure you are taking your Rosuvastatin (Crestor) nightly while working on healthy diet and regular exercise.  Everything else looks great!

## 2023-02-08 ENCOUNTER — Telehealth: Payer: BC Managed Care – PPO | Admitting: Physician Assistant

## 2023-02-08 DIAGNOSIS — J42 Unspecified chronic bronchitis: Secondary | ICD-10-CM

## 2023-02-08 MED ORDER — DOXYCYCLINE HYCLATE 100 MG PO TABS: 100.0000 mg | ORAL_TABLET | Freq: Two times a day (BID) | ORAL | 0 refills | Status: DC

## 2023-02-08 MED ORDER — BENZONATATE 100 MG PO CAPS: 100.0000 mg | ORAL_CAPSULE | Freq: Three times a day (TID) | ORAL | 0 refills | Status: DC | PRN

## 2023-02-08 NOTE — Progress Notes (Signed)

## 2023-02-08 NOTE — Progress Notes (Signed)
I have spent 5 minutes in review of e-visit questionnaire, review and updating patient chart, medical decision making and response to patient.   Mia Milan Cody Jacklynn Dehaas, PA-C    

## 2023-02-08 NOTE — Progress Notes (Signed)
Message sent to patient requesting further input regarding current symptoms. Awaiting patient response.  

## 2023-02-18 ENCOUNTER — Other Ambulatory Visit: Payer: Self-pay | Admitting: Pulmonary Disease

## 2023-02-18 NOTE — Telephone Encounter (Signed)
Pt needs appointment for further refills 

## 2023-02-20 ENCOUNTER — Other Ambulatory Visit: Payer: Self-pay | Admitting: Family Medicine

## 2023-03-10 ENCOUNTER — Telehealth: Payer: Managed Care, Other (non HMO) | Admitting: Physician Assistant

## 2023-03-10 DIAGNOSIS — J410 Simple chronic bronchitis: Secondary | ICD-10-CM

## 2023-03-10 DIAGNOSIS — R058 Other specified cough: Secondary | ICD-10-CM

## 2023-03-10 MED ORDER — ALBUTEROL SULFATE HFA 108 (90 BASE) MCG/ACT IN AERS: 1.0000 | INHALATION_SPRAY | Freq: Four times a day (QID) | RESPIRATORY_TRACT | 0 refills | Status: DC | PRN

## 2023-03-10 MED ORDER — PREDNISONE 10 MG (21) PO TBPK: ORAL_TABLET | ORAL | 0 refills | Status: DC

## 2023-03-10 NOTE — Progress Notes (Signed)
 Virtual Visit Consent   [name], you are scheduled for a virtual visit with a [facility] provider today. Just as with appointments in the office, your consent must be obtained to participate. Your consent will be active for this visit and any virtual visit you may have with one of our providers in the next 365 days. If you have a MyChart account, a copy of this consent can be sent to you electronically.  As this is a virtual visit, video technology does not allow for your provider to perform a traditional examination. This may limit your provider's ability to fully assess your condition. If your provider identifies any concerns that need to be evaluated in person or the need to arrange testing (such as labs, EKG, etc.), we will make arrangements to do so. Although advances in technology are sophisticated, we cannot ensure that it will always work on either your end or our end. If the connection with a video visit is poor, the visit may have to be switched to a telephone visit. With either a video or telephone visit, we are not always able to ensure that we have a secure connection.  By engaging in this virtual visit, you consent to the provision of healthcare and authorize for your insurance to be billed (if applicable) for the services provided during this visit. Depending on your insurance coverage, you may receive a charge related to this service.  I need to obtain your verbal consent now. Are you willing to proceed with your visit today? [name] has provided verbal consent on 03/10/2023 for a virtual visit (video or telephone). Delon CHRISTELLA Dickinson, PA-C  Date: 03/10/2023 10:18 AM  Virtual Visit via Video Note   I, Delon CHRISTELLA Dickinson, connected with  Rachel Fitzpatrick  ([id number], 02-Dec-1960) on 03/10/23 at 10:00 AM EST by a video-enabled telemedicine application and verified that I am speaking with the correct person using two identifiers.  Location: Patient: Virtual Visit Location  Patient: Home Provider: Virtual Visit Location Provider: Home Office   I discussed the limitations of evaluation and management by telemedicine and the availability of in person appointments. The patient expressed understanding and agreed to proceed.    History of Present Illness: Rachel Fitzpatrick is a 63 y.o. who identifies as a female who was assigned female at birth, and is being seen today for cough.  HPI: Cough This is a new problem. The current episode started more than 1 month ago (Started in Nov, seen in Dec, virtually, and treated with Doxycycline , Tessalon  perles, and codeine  cough syrup). The problem has been gradually worsening. The problem occurs constantly. The cough is Productive of sputum. Associated symptoms include chest pain (tightness) and wheezing. Pertinent negatives include no chills, ear congestion, ear pain, fever, headaches, nasal congestion, postnasal drip, sore throat or shortness of breath. The symptoms are aggravated by lying down. She has tried prescription cough suppressant for the symptoms. The treatment provided no relief. Her past medical history is significant for bronchitis.   Has been diagnosed with chronic bronchitis following Covid 19. Has seen Pulmonology, last seen 04/15/2022.  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 TAKE WITH A FULL GLASS OF WATER ON AN EMPTY STOMACH., Disp: 12 tablet, Rfl: 7   benzonatate  (TESSALON ) 100 MG capsule, Take 1 capsule (100 mg total) by mouth 3 (three) times daily as needed for cough., Disp: 30 capsule, Rfl: 0   Calcium  Citrate-Vitamin D  (CALCIUM  + D PO), Take 1 tablet by mouth 2 (two) times daily., Disp: , Rfl:    cetirizine  (ZYRTEC ) 10 MG tablet, Take 1 tablet (10 mg total) by mouth daily. (Patient taking differently: Take 10 mg by mouth daily as needed for allergies.), Disp: 30 tablet, Rfl: 11   clotrimazole-betamethasone (LOTRISONE) cream, PLEASE SEE ATTACHED FOR DETAILED DIRECTIONS, Disp: , Rfl:    doxycycline  (VIBRA -TABS) 100 MG tablet, Take 1 tablet (100 mg total) by mouth 2 (two) times daily., Disp: 14 tablet, Rfl: 0   FEROSUL 325 (65 Fe) MG tablet, TAKE 1 TABLET BY MOUTH EVERY DAY WITH BREAKFAST, Disp: 90 tablet, Rfl: 2   fluticasone  (FLONASE ) 50 MCG/ACT nasal spray, Place 1 spray into both nostrils daily., Disp: , Rfl:    HYDROcodone  bit-homatropine (HYCODAN) 5-1.5 MG/5ML syrup, Take 5 mLs by mouth 4 (four) times daily., Disp: , Rfl:    ipratropium (ATROVENT ) 0.03 % nasal spray, Place 2 sprays into both nostrils every 12 (twelve) hours., Disp: 30 mL, Rfl: 12   meloxicam (MOBIC) 15 MG tablet, Take 15 mg by mouth daily., Disp: , Rfl:    montelukast  (SINGULAIR ) 10 MG tablet, TAKE 1 TABLET BY MOUTH EVERYDAY AT BEDTIME, Disp: 90 tablet, Rfl: 2   omeprazole  (PRILOSEC) 20 MG capsule, Take 20 mg by mouth daily as needed (heartburn)., Disp: , Rfl:    rosuvastatin  (CRESTOR ) 20 MG tablet, TAKE 1 TABLET BY MOUTH ONCE A  DAY FOR CHOLESTEROL, Disp: 90 tablet, Rfl: 1   sertraline  (ZOLOFT ) 100 MG tablet, TAKE 2 TABLETS BY MOUTH EVERY DAY, Disp: 180 tablet, Rfl: 1  Observations/Objective: Patient is well-developed, well-nourished in no acute distress.  Resting comfortably at home.  Head is normocephalic, atraumatic.  No labored breathing.  Speech is clear and coherent with logical content.  Patient is alert and oriented at baseline.    Assessment and Plan: 1. Simple chronic bronchitis (HCC) (Primary) - albuterol  (VENTOLIN  HFA) 108 (90 Base) MCG/ACT inhaler; Inhale 1-2 puffs into the lungs every 6 (six) hours as needed.  Dispense: 18 g; Refill: 0 - predniSONE  (STERAPRED UNI-PAK 21 TAB) 10 MG (21) TBPK tablet; 6 day taper; take as directed on package instructions  Dispense: 21 tablet; Refill: 0  2. Post-viral cough syndrome - albuterol  (VENTOLIN  HFA) 108 (90 Base) MCG/ACT inhaler; Inhale 1-2 puffs into the lungs every 6 (six) hours as needed.  Dispense: 18 g; Refill: 0 - predniSONE  (STERAPRED UNI-PAK 21 TAB) 10 MG (21) TBPK tablet; 6 day taper; take as directed on package instructions  Dispense: 21 tablet; Refill: 0  - Suspect post-infective cough syndrome - Add Prednisone  for inflammation - Add Albuterol  - Discussed potential of needing a LABA due to chronic bronchitis diagnosis and to discuss with her PCP - Steam and humidifier can help - Seek in person evaluation if worsening  Follow Up Instructions: I discussed the assessment and treatment plan with the patient. The patient was provided an opportunity to ask questions and all were answered. The patient agreed with the plan and demonstrated an understanding of the instructions.  A copy of instructions were sent to the patient via MyChart unless otherwise noted below.    The patient was advised to call back or seek an in-person evaluation if the symptoms worsen or if the condition fails to improve as anticipated.    Delon CHRISTELLA Dickinson, PA-C

## 2023-03-10 NOTE — Patient Instructions (Signed)
 Rachel Fitzpatrick, thank you for joining Rachel CHRISTELLA Dickinson, PA-C for today's virtual visit.  While this provider is not your primary care provider (PCP), if your PCP is located in our provider database this encounter information will be shared with them immediately following your visit.   A [facility] MyChart account gives you access to today's visit and all your visits, tests, and labs performed at [facility]  click here if you don't have a [facility] MyChart account or go to [email or web address]  Consent: (Patient) Rachel Fitzpatrick provided verbal consent for this virtual visit at the beginning of the encounter.  Current Medications:  Current Outpatient Medications:    albuterol  (VENTOLIN  HFA) 108 (90 Base) MCG/ACT inhaler, Inhale 1-2 puffs into the lungs every 6 (six) hours as needed., Disp: 18 g, Rfl: 0   predniSONE  (STERAPRED UNI-PAK 21 TAB) 10 MG (21) TBPK tablet, 6 day taper; take as directed on package instructions, Disp: 21 tablet, Rfl: 0   alendronate  (FOSAMAX ) 70 MG tablet, TAKE 1 TABLET BY MOUTH EVERY 7 DAYS. TAKE WITH A FULL GLASS OF WATER ON AN EMPTY STOMACH., Disp: 12 tablet, Rfl: 7   benzonatate  (TESSALON ) 100 MG capsule, Take 1 capsule (100 mg total) by mouth 3 (three) times daily as needed for cough., Disp: 30 capsule, Rfl: 0   Calcium  Citrate-Vitamin D  (CALCIUM  + D PO), Take 1 tablet by mouth 2 (two) times daily., Disp: , Rfl:    cetirizine  (ZYRTEC ) 10 MG tablet, Take 1 tablet (10 mg total) by mouth daily. (Patient taking differently: Take 10 mg by mouth daily as needed for allergies.), Disp: 30 tablet, Rfl: 11   clotrimazole-betamethasone (LOTRISONE) cream, PLEASE SEE ATTACHED FOR DETAILED DIRECTIONS, Disp: , Rfl:    doxycycline  (VIBRA -TABS) 100 MG tablet, Take 1 tablet (100 mg total) by mouth 2 (two) times daily., Disp: 14 tablet, Rfl: 0   FEROSUL 325 (65 Fe) MG tablet, TAKE 1 TABLET BY MOUTH EVERY DAY WITH BREAKFAST, Disp: 90 tablet, Rfl: 2   fluticasone   (FLONASE ) 50 MCG/ACT nasal spray, Place 1 spray into both nostrils daily., Disp: , Rfl:    HYDROcodone  bit-homatropine (HYCODAN) 5-1.5 MG/5ML syrup, Take 5 mLs by mouth 4 (four) times daily., Disp: , Rfl:    ipratropium (ATROVENT ) 0.03 % nasal spray, Place 2 sprays into both nostrils every 12 (twelve) hours., Disp: 30 mL, Rfl: 12   meloxicam (MOBIC) 15 MG tablet, Take 15 mg by mouth daily., Disp: , Rfl:    montelukast  (SINGULAIR ) 10 MG tablet, TAKE 1 TABLET BY MOUTH EVERYDAY AT BEDTIME, Disp: 90 tablet, Rfl: 2   omeprazole  (PRILOSEC) 20 MG capsule, Take 20 mg by mouth daily as needed (heartburn)., Disp: , Rfl:    rosuvastatin  (CRESTOR ) 20 MG tablet, TAKE 1 TABLET BY MOUTH ONCE A DAY FOR CHOLESTEROL, Disp: 90 tablet, Rfl: 1   sertraline  (ZOLOFT ) 100 MG tablet, TAKE 2 TABLETS BY MOUTH EVERY DAY, Disp: 180 tablet, Rfl: 1   Medications ordered in this encounter:  Meds ordered this encounter  Medications   albuterol  (VENTOLIN  HFA) 108 (90 Base) MCG/ACT inhaler    Sig: Inhale 1-2 puffs into the lungs every 6 (six) hours as needed.    Dispense:  18 g    Refill:  0    Supervising Provider:   BLAISE ALEENE KIDD [[id number]]   predniSONE  (STERAPRED UNI-PAK 21 TAB) 10 MG (21) TBPK tablet    Sig: 6 day taper; take as directed on package instructions    Dispense:  21  tablet    Refill:  0    Supervising Provider:   BLAISE ALEENE KIDD [[id number]]     *If you need refills on other medications prior to your next appointment, please contact your pharmacy*  Follow-Up: Call back or seek an in-person evaluation if the symptoms worsen or if the condition fails to improve as anticipated.  [facility] [phone]  Other Instructions What is Chronic Bronchitis? This video will teach you about the lung disease chronic bronchitis and how it affects the body. To view the content, go to this web address: [email or web address]  This video will expire on: 08/22/2024. If you need access to  this video following this date, please reach out to the healthcare provider who assigned it to you. This information is not intended to replace advice given to you by your health care provider. Make sure you discuss any questions you have with your health care provider. Elsevier Patient Education  2024 Elsevier Inc.    If you have been instructed to have an in-person evaluation today at a local Urgent Care facility, please use the link below. It will take you to a list of all of our available [facility], including address, phone number and hours of operation. Please do not delay care.  [place]  If you or a family member do not have a primary care provider, use the link below to schedule a visit and establish care. When you choose a [facility] primary care physician or advanced practice provider, you gain a long-term partner in health. Find a Primary Care Provider  Learn more about [facility]'s in-office and virtual care options: [place] - Get Care Now

## 2023-03-24 ENCOUNTER — Encounter: Payer: Self-pay | Admitting: Family Medicine

## 2023-03-24 ENCOUNTER — Ambulatory Visit (INDEPENDENT_AMBULATORY_CARE_PROVIDER_SITE_OTHER): Payer: Managed Care, Other (non HMO) | Admitting: Family Medicine

## 2023-03-24 VITALS — BP 112/68 | HR 107 | Temp 97.0°F | Ht 61.0 in | Wt 150.5 lb

## 2023-03-24 DIAGNOSIS — U071 COVID-19: Secondary | ICD-10-CM

## 2023-03-24 DIAGNOSIS — R051 Acute cough: Secondary | ICD-10-CM | POA: Diagnosis not present

## 2023-03-24 LAB — POC COVID19 BINAXNOW: SARS Coronavirus 2 Ag: POSITIVE — AB

## 2023-03-24 MED ORDER — HYDROCODONE BIT-HOMATROP MBR 5-1.5 MG/5ML PO SOLN: 5.0000 mL | Freq: Four times a day (QID) | ORAL | 0 refills | Status: DC

## 2023-03-24 NOTE — Progress Notes (Signed)
   Subjective:    Patient ID: Rachel Fitzpatrick, female    DOB: 1960-04-19, 63 y.o.   MRN: [id number]  HPI COVID- Had HA on Monday.  Felt worse on Tuesday.  Pt had + home test on Tuesday.  + hacking cough, body aches.  Not interested in Paxlovid.  Has just recovered from bronchitis.  Asking for more cough syrup   Review of Systems For ROS see HPI     Objective:   Physical Exam Vitals reviewed.  Constitutional:      General: She is not in acute distress.    Appearance: She is well-developed. She is ill-appearing.     Comments: Obviously not feeling well  HENT:     Head: Normocephalic and atraumatic.     Right Ear: Tympanic membrane normal.     Left Ear: Tympanic membrane normal.     Nose: Mucosal edema, congestion and rhinorrhea present.     Right Sinus: No maxillary sinus tenderness or frontal sinus tenderness.     Left Sinus: No maxillary sinus tenderness or frontal sinus tenderness.     Mouth/Throat:     Pharynx: Uvula midline.  Eyes:     Conjunctiva/sclera: Conjunctivae normal.     Pupils: Pupils are equal, round, and reactive to light.  Cardiovascular:     Rate and Rhythm: Normal rate and regular rhythm.     Heart sounds: Normal heart sounds.  Pulmonary:     Effort: Pulmonary effort is normal. No respiratory distress.     Breath sounds: Normal breath sounds. No wheezing.     Comments: + hacking cough Musculoskeletal:     Cervical back: Normal range of motion and neck supple.  Lymphadenopathy:     Cervical: No cervical adenopathy.  Skin:    General: Skin is warm.  Neurological:     Mental Status: She is alert and oriented to person, place, and time.  Psychiatric:        Mood and Affect: Mood normal.        Behavior: Behavior normal.        Thought Content: Thought content normal.           Assessment & Plan:  COVID- new.  Pt tested + at home and work is requiring a + test here in the office.  Test was immediately +.  She is not interested in Paxlovid and  prefers to ride it out w/ supportive care.  Cough syrup and albuterol as needed.  Reviewed supportive care and red flags that should prompt return.  Pt expressed understanding and is in agreement w/ plan.

## 2023-03-24 NOTE — Patient Instructions (Signed)
Follow up as needed or as scheduled Take a home COVID test Monday to see if it is negative.  If it is negative, schedule a nurse visit for Tuesday so we can provide the documentation you need.  If still positive, we'll wait. Drink LOTS of fluids REST! Use the cough syrup as needed Albuterol- 2 puffs- every 4 hrs for cough/wheezing/shortness of breath Call with any questions or concerns Hang in there!!

## 2023-03-28 ENCOUNTER — Ambulatory Visit (INDEPENDENT_AMBULATORY_CARE_PROVIDER_SITE_OTHER): Payer: Managed Care, Other (non HMO) | Admitting: Family Medicine

## 2023-03-28 DIAGNOSIS — R051 Acute cough: Secondary | ICD-10-CM | POA: Diagnosis not present

## 2023-03-28 LAB — POC COVID19 BINAXNOW: SARS Coronavirus 2 Ag: NEGATIVE

## 2023-03-28 NOTE — Progress Notes (Signed)
Per Dr.Tabori orders Patient came in for Nurse Visit for POC Covid Test. This was negative  Gave patient return to work note

## 2023-04-18 ENCOUNTER — Encounter: Payer: Self-pay | Admitting: Family Medicine

## 2023-04-18 ENCOUNTER — Encounter: Payer: Self-pay | Admitting: Physician Assistant

## 2023-04-18 ENCOUNTER — Telehealth: Payer: Managed Care, Other (non HMO) | Admitting: Physician Assistant

## 2023-04-18 DIAGNOSIS — R058 Other specified cough: Secondary | ICD-10-CM

## 2023-04-18 NOTE — Patient Instructions (Signed)
Rachel Fitzpatrick, thank you for joining Laure Kidney, PA-C for today's virtual visit.  While this provider is not your primary care provider (PCP), if your PCP is located in our provider database this encounter information will be shared with them immediately following your visit.   A [facility] MyChart account gives you access to today's visit and all your visits, tests, and labs performed at [facility] " click here if you don't have a [facility] MyChart account or go to [email or web address]  Consent: (Patient) Rachel Fitzpatrick provided verbal consent for this virtual visit at the beginning of the encounter.  Current Medications:  Current Outpatient Medications:    albuterol (VENTOLIN HFA) 108 (90 Base) MCG/ACT inhaler, Inhale 1-2 puffs into the lungs every 6 (six) hours as needed., Disp: 18 g, Rfl: 0   alendronate (FOSAMAX) 70 MG tablet, TAKE 1 TABLET BY MOUTH EVERY 7 DAYS. TAKE WITH A FULL GLASS OF WATER ON AN EMPTY STOMACH., Disp: 12 tablet, Rfl: 7   benzonatate (TESSALON) 100 MG capsule, Take 1 capsule (100 mg total) by mouth 3 (three) times daily as needed for cough., Disp: 30 capsule, Rfl: 0   Calcium Citrate-Vitamin D (CALCIUM + D PO), Take 1 tablet by mouth 2 (two) times daily., Disp: , Rfl:    cetirizine (ZYRTEC) 10 MG tablet, Take 1 tablet (10 mg total) by mouth daily. (Patient taking differently: Take 10 mg by mouth daily as needed for allergies.), Disp: 30 tablet, Rfl: 11   clotrimazole-betamethasone (LOTRISONE) cream, PLEASE SEE ATTACHED FOR DETAILED DIRECTIONS, Disp: , Rfl:    doxycycline (VIBRA-TABS) 100 MG tablet, Take 1 tablet (100 mg total) by mouth 2 (two) times daily., Disp: 14 tablet, Rfl: 0   FEROSUL 325 (65 Fe) MG tablet, TAKE 1 TABLET BY MOUTH EVERY DAY WITH BREAKFAST, Disp: 90 tablet, Rfl: 2   fluticasone (FLONASE) 50 MCG/ACT nasal spray, Place 1 spray into both nostrils daily., Disp: , Rfl:    HYDROcodone bit-homatropine (HYCODAN) 5-1.5 MG/5ML syrup,  Take 5 mLs by mouth 4 (four) times daily., Disp: 120 mL, Rfl: 0   ipratropium (ATROVENT) 0.03 % nasal spray, Place 2 sprays into both nostrils every 12 (twelve) hours., Disp: 30 mL, Rfl: 12   meloxicam (MOBIC) 15 MG tablet, Take 15 mg by mouth daily., Disp: , Rfl:    montelukast (SINGULAIR) 10 MG tablet, TAKE 1 TABLET BY MOUTH EVERYDAY AT BEDTIME, Disp: 90 tablet, Rfl: 2   omeprazole (PRILOSEC) 20 MG capsule, Take 20 mg by mouth daily as needed (heartburn)., Disp: , Rfl:    predniSONE (STERAPRED UNI-PAK 21 TAB) 10 MG (21) TBPK tablet, 6 day taper; take as directed on package instructions, Disp: 21 tablet, Rfl: 0   rosuvastatin (CRESTOR) 20 MG tablet, TAKE 1 TABLET BY MOUTH ONCE A DAY FOR CHOLESTEROL, Disp: 90 tablet, Rfl: 1   sertraline (ZOLOFT) 100 MG tablet, TAKE 2 TABLETS BY MOUTH EVERY DAY, Disp: 180 tablet, Rfl: 1   Medications ordered in this encounter:  No orders of the defined types were placed in this encounter.    *If you need refills on other medications prior to your next appointment, please contact your pharmacy*  Follow-Up: Call back or seek an in-person evaluation if the symptoms worsen or if the condition fails to improve as anticipated.  [facility] [phone]  Other Instructions Report to urgent care for evaluation. Report to ER with worsening symptoms.   If you have been instructed to have an in-person evaluation today  at a local Urgent Care facility, please use the link below. It will take you to a list of all of our available [facility], including address, phone number and hours of operation. Please do not delay care.  [facility]  If you or a family member do not have a primary care provider, use the link below to schedule a visit and establish care. When you choose a [facility] primary care physician or advanced practice provider, you gain a long-term partner in health. Find a Primary Care Provider  Learn more about  [facility]'s in-office and virtual care options: [facility] - Get Care Now

## 2023-04-18 NOTE — Progress Notes (Signed)
Virtual Visit Consent   Rachel Fitzpatrick, you are scheduled for a virtual visit with a [facility] provider today. Just as with appointments in the office, your consent must be obtained to participate. Your consent will be active for this visit and any virtual visit you may have with one of our providers in the next 365 days. If you have a MyChart account, a copy of this consent can be sent to you electronically.  As this is a virtual visit, video technology does not allow for your provider to perform a traditional examination. This may limit your provider's ability to fully assess your condition. If your provider identifies any concerns that need to be evaluated in person or the need to arrange testing (such as labs, EKG, etc.), we will make arrangements to do so. Although advances in technology are sophisticated, we cannot ensure that it will always work on either your end or our end. If the connection with a video visit is poor, the visit may have to be switched to a telephone visit. With either a video or telephone visit, we are not always able to ensure that we have a secure connection.  By engaging in this virtual visit, you consent to the provision of healthcare and authorize for your insurance to be billed (if applicable) for the services provided during this visit. Depending on your insurance coverage, you may receive a charge related to this service.  I need to obtain your verbal consent now. Are you willing to proceed with your visit today? Rachel Fitzpatrick has provided verbal consent on 04/18/2023 for a virtual visit (video or telephone). Laure Kidney, [place]  Date: 04/18/2023 3:24 PM   Virtual Visit via Video Note   I, Laure Kidney, connected with  Rachel Fitzpatrick  ([id number], April 27, 1960) on 04/18/23 at  3:15 PM EST by a video-enabled telemedicine application and verified that I am speaking with the correct person using two identifiers.  Location: Patient: Virtual Visit Location Patient:  Home Provider: Virtual Visit Location Provider: Home Office   I discussed the limitations of evaluation and management by telemedicine and the availability of in person appointments. The patient expressed understanding and agreed to proceed.    History of Present Illness: Rachel Fitzpatrick is a 63 y.o. who identifies as a female who was assigned female at birth, and is being seen today for ongoing cough since having COVID in January. She states her symptoms initially started in November, continued in December. She states the cough is ongoing and will not go away.   HPI: Cough This is a new problem. The current episode started more than 1 month ago. The problem has been waxing and waning. The problem occurs constantly. The cough is Productive of sputum. Pertinent negatives include no chest pain, chills, ear congestion, ear pain, fever, headaches, heartburn, hemoptysis, myalgias, nasal congestion, postnasal drip, rash, rhinorrhea, sore throat, shortness of breath, sweats, weight loss or wheezing. Nothing aggravates the symptoms. She has tried steroid inhaler, oral steroids, OTC cough suppressant, OTC inhaler and prescription cough suppressant for the symptoms. The treatment provided no relief. Her past medical history is significant for bronchitis. There is no history of COPD.    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 TAKE WITH A FULL GLASS OF WATER ON AN EMPTY STOMACH., Disp: 12 tablet, Rfl: 7   benzonatate (TESSALON) 100 MG capsule, Take 1 capsule (100 mg total) by mouth 3 (three) times daily as needed for cough., Disp: 30 capsule, Rfl: 0   Calcium Citrate-Vitamin D (CALCIUM + D PO), Take 1 tablet by mouth 2 (two) times daily., Disp: , Rfl:    cetirizine (ZYRTEC) 10 MG tablet, Take 1 tablet (10 mg total) by mouth daily. (Patient taking differently: Take 10 mg by mouth daily as needed for allergies.), Disp: 30 tablet, Rfl: 11   clotrimazole-betamethasone (LOTRISONE) cream, PLEASE SEE ATTACHED FOR DETAILED DIRECTIONS, Disp: , Rfl:    doxycycline (VIBRA-TABS) 100 MG tablet, Take 1 tablet (100 mg total) by mouth 2 (two) times daily., Disp: 14 tablet, Rfl: 0   FEROSUL 325 (65 Fe) MG tablet, TAKE 1 TABLET BY MOUTH EVERY DAY WITH BREAKFAST, Disp: 90 tablet, Rfl: 2   fluticasone (FLONASE) 50 MCG/ACT nasal spray, Place 1 spray into both nostrils daily., Disp: , Rfl:    HYDROcodone bit-homatropine (HYCODAN) 5-1.5 MG/5ML syrup, Take 5 mLs by mouth 4 (four) times daily., Disp: 120 mL, Rfl: 0   ipratropium (ATROVENT) 0.03 % nasal spray, Place 2 sprays into both nostrils every 12 (twelve) hours., Disp: 30 mL, Rfl: 12   meloxicam (MOBIC) 15 MG tablet, Take 15 mg by mouth daily., Disp: , Rfl:    montelukast (SINGULAIR) 10 MG tablet, TAKE 1 TABLET BY MOUTH EVERYDAY AT BEDTIME, Disp: 90 tablet, Rfl: 2   omeprazole (PRILOSEC) 20 MG capsule, Take 20 mg by mouth daily as needed (heartburn)., Disp: , Rfl:    predniSONE (STERAPRED UNI-PAK 21 TAB) 10 MG (21) TBPK tablet, 6 day taper; take as directed on package instructions, Disp: 21 tablet, Rfl: 0    rosuvastatin (CRESTOR) 20 MG tablet, TAKE 1 TABLET BY MOUTH ONCE A DAY FOR CHOLESTEROL, Disp: 90 tablet, Rfl: 1   sertraline (ZOLOFT) 100 MG tablet, TAKE 2 TABLETS BY MOUTH EVERY DAY, Disp: 180 tablet, Rfl: 1  Observations/Objective: Patient is well-developed, well-nourished in no acute distress.  Resting comfortably  at home.  Head is normocephalic, atraumatic.  No labored breathing.  Speech is clear and coherent with logical content.  Patient is alert and oriented at baseline.  Multiple episodes of cough  Assessment and Plan: 1. Cough present for greater than 3 weeks (Primary) Patient with cough since November, ongoing at this time after steroids. No recent chest x ray. Patient advised to present to Urgent Care for chest xray to exclude other sources of ongoing cough. She is in agreement with this plan and verbalized understanding. Chart sent to primary care provider.   Follow Up Instructions: I discussed the assessment and treatment plan with the patient. The patient was provided an opportunity to ask questions and all were answered. The patient agreed with the plan  and demonstrated an understanding of the instructions.  A copy of instructions were sent to the patient via MyChart unless otherwise noted below.     The patient was advised to call back or seek an in-person evaluation if the symptoms worsen or if the condition fails to improve as anticipated.    Laure Kidney, PA-C

## 2023-04-20 NOTE — Telephone Encounter (Signed)
Patient following up on message sent from another dr requesting you to order scans   Please advise

## 2023-04-21 NOTE — Telephone Encounter (Signed)
Patient notes any imaging in [place] or [place], would you like to advise [facility] location for imaging?

## 2023-04-22 ENCOUNTER — Ambulatory Visit
Admission: RE | Admit: 2023-04-22 | Discharge: 2023-04-22 | Disposition: A | Discharge status: Home / Self Care | Payer: Managed Care, Other (non HMO) | Source: Ambulatory Visit | Attending: Family Medicine | Admitting: Family Medicine

## 2023-04-22 DIAGNOSIS — R058 Other specified cough: Secondary | ICD-10-CM | POA: Diagnosis not present

## 2023-05-02 ENCOUNTER — Encounter: Payer: Self-pay | Admitting: Family Medicine

## 2023-05-02 ENCOUNTER — Other Ambulatory Visit: Payer: Self-pay

## 2023-05-02 MED ORDER — OMEPRAZOLE 20 MG PO CPDR: 20.0000 mg | DELAYED_RELEASE_CAPSULE | Freq: Every day | ORAL | 1 refills | Status: DC

## 2023-05-02 MED ORDER — OMEPRAZOLE 20 MG PO CPDR: 20.0000 mg | DELAYED_RELEASE_CAPSULE | Freq: Every day | ORAL | 3 refills | Status: DC

## 2023-05-10 MED ORDER — OMEPRAZOLE 20 MG PO CPDR: 20.0000 mg | DELAYED_RELEASE_CAPSULE | Freq: Every day | ORAL | 1 refills | Status: AC

## 2023-05-10 MED ORDER — SERTRALINE HCL 100 MG PO TABS: 200.0000 mg | ORAL_TABLET | Freq: Every day | ORAL | 1 refills | Status: DC

## 2023-05-11 ENCOUNTER — Other Ambulatory Visit: Payer: Self-pay | Admitting: Family Medicine

## 2023-05-17 ENCOUNTER — Other Ambulatory Visit: Payer: Self-pay

## 2023-05-17 MED ORDER — ROSUVASTATIN CALCIUM 20 MG PO TABS: 20.0000 mg | ORAL_TABLET | Freq: Every day | ORAL | 1 refills | Status: DC

## 2023-05-25 NOTE — Telephone Encounter (Signed)
 Patient asking for you to take over montelukast notes has been rxd by allergist but pt requesting rx from you, okay to sign this?

## 2023-05-26 MED ORDER — ALENDRONATE SODIUM 70 MG PO TABS
70.0000 mg | ORAL_TABLET | ORAL | 7 refills | Status: AC
Start: 2023-05-26 — End: ?

## 2023-05-26 MED ORDER — MONTELUKAST SODIUM 10 MG PO TABS: 10.0000 mg | ORAL_TABLET | Freq: Every day | ORAL | 2 refills | Status: AC

## 2023-06-18 ENCOUNTER — Other Ambulatory Visit: Payer: Self-pay | Admitting: Family Medicine

## 2023-07-15 ENCOUNTER — Other Ambulatory Visit: Payer: Self-pay | Admitting: Family Medicine

## 2023-10-17 ENCOUNTER — Other Ambulatory Visit: Payer: Self-pay | Admitting: Family Medicine

## 2023-11-02 ENCOUNTER — Other Ambulatory Visit: Payer: Self-pay | Admitting: Family Medicine

## 2024-01-31 ENCOUNTER — Other Ambulatory Visit: Payer: Self-pay | Admitting: Family Medicine

## 2024-03-14 ENCOUNTER — Other Ambulatory Visit: Payer: Self-pay | Admitting: Family Medicine

## 2024-03-14 NOTE — Telephone Encounter (Signed)
 Patient needs an appt last OV acute visit on 03/28/2023, will call to schedule

## 2024-03-21 ENCOUNTER — Ambulatory Visit: Admitting: Family Medicine

## 2024-03-21 ENCOUNTER — Encounter: Payer: Self-pay | Admitting: Family Medicine

## 2024-03-21 VITALS — BP 120/74 | HR 74 | Ht 61.0 in | Wt 142.2 lb

## 2024-03-21 DIAGNOSIS — E785 Hyperlipidemia, unspecified: Secondary | ICD-10-CM

## 2024-03-21 DIAGNOSIS — K449 Diaphragmatic hernia without obstruction or gangrene: Secondary | ICD-10-CM | POA: Insufficient documentation

## 2024-03-21 DIAGNOSIS — E663 Overweight: Secondary | ICD-10-CM | POA: Diagnosis not present

## 2024-03-21 DIAGNOSIS — D649 Anemia, unspecified: Secondary | ICD-10-CM | POA: Insufficient documentation

## 2024-03-21 DIAGNOSIS — K219 Gastro-esophageal reflux disease without esophagitis: Secondary | ICD-10-CM | POA: Insufficient documentation

## 2024-03-21 LAB — CBC WITH DIFFERENTIAL/PLATELET
Basophils Absolute: 0 K/uL (ref 0.0–0.1)
Basophils Relative: 0.3 % (ref 0.0–3.0)
Eosinophils Absolute: 0.1 K/uL (ref 0.0–0.7)
Eosinophils Relative: 2.2 % (ref 0.0–5.0)
HCT: 38.4 % (ref 36.0–46.0)
Hemoglobin: 13.4 g/dL (ref 12.0–15.0)
Lymphocytes Relative: 41.4 % (ref 12.0–46.0)
Lymphs Abs: 1.5 K/uL (ref 0.7–4.0)
MCHC: 34.8 g/dL (ref 30.0–36.0)
MCV: 91.4 fl (ref 78.0–100.0)
Monocytes Absolute: 0.2 K/uL (ref 0.1–1.0)
Monocytes Relative: 6.4 % (ref 3.0–12.0)
Neutro Abs: 1.8 K/uL (ref 1.4–7.7)
Neutrophils Relative %: 49.7 % (ref 43.0–77.0)
Platelets: 170 K/uL (ref 150.0–400.0)
RBC: 4.21 Mil/uL (ref 3.87–5.11)
RDW: 12.9 % (ref 11.5–15.5)
WBC: 3.7 K/uL — ABNORMAL LOW (ref 4.0–10.5)

## 2024-03-21 LAB — BASIC METABOLIC PANEL WITH GFR
BUN: 14 mg/dL (ref 6–23)
CO2: 27 meq/L (ref 19–32)
Calcium: 9.7 mg/dL (ref 8.4–10.5)
Chloride: 101 meq/L (ref 96–112)
Creatinine, Ser: 0.63 mg/dL (ref 0.40–1.20)
GFR: 95.25 mL/min
Glucose, Bld: 83 mg/dL (ref 70–99)
Potassium: 4.5 meq/L (ref 3.5–5.1)
Sodium: 136 meq/L (ref 135–145)

## 2024-03-21 LAB — TSH: TSH: 2.6 u[IU]/mL (ref 0.35–5.50)

## 2024-03-21 LAB — LIPID PANEL
Cholesterol: 278 mg/dL — ABNORMAL HIGH (ref 28–200)
HDL: 111.5 mg/dL
LDL Cholesterol: 136 mg/dL — ABNORMAL HIGH (ref 10–99)
NonHDL: 166.64
Total CHOL/HDL Ratio: 2
Triglycerides: 152 mg/dL — ABNORMAL HIGH (ref 10.0–149.0)
VLDL: 30.4 mg/dL (ref 0.0–40.0)

## 2024-03-21 LAB — HEPATIC FUNCTION PANEL
ALT: 12 U/L (ref 3–35)
AST: 25 U/L (ref 5–37)
Albumin: 4.7 g/dL (ref 3.5–5.2)
Alkaline Phosphatase: 46 U/L (ref 39–117)
Bilirubin, Direct: 0.1 mg/dL (ref 0.1–0.3)
Total Bilirubin: 0.5 mg/dL (ref 0.2–1.2)
Total Protein: 7.4 g/dL (ref 6.0–8.3)

## 2024-03-21 NOTE — Progress Notes (Signed)
" ° °  Subjective:    Patient ID: Rachel Fitzpatrick, female    DOB: 08-10-1960, 64 y.o.   MRN: [id number]  HPI Hyperlipidemia- chronic problem, on Crestor  20mg .  Denies CP, SOB, abd pain, N/V.  Strong family hx.  Overweight- down 8 lbs since last visit.  Using compounded Semaglutide . 'i feel so much better'.   Review of Systems For ROS see HPI     Objective:   Physical Exam Constitutional:      General: She is not in acute distress.    Appearance: Normal appearance. She is well-developed. She is not ill-appearing.  HENT:     Head: Normocephalic and atraumatic.  Eyes:     Conjunctiva/sclera: Conjunctivae normal.     Pupils: Pupils are equal, round, and reactive to light.  Neck:     Thyroid : No thyromegaly.  Cardiovascular:     Rate and Rhythm: Normal rate and regular rhythm.     Pulses: Normal pulses.     Heart sounds: Normal heart sounds. No murmur heard. Pulmonary:     Effort: Pulmonary effort is normal. No respiratory distress.     Breath sounds: Normal breath sounds.  Abdominal:     General: There is no distension.     Palpations: Abdomen is soft.     Tenderness: There is no abdominal tenderness.  Musculoskeletal:     Cervical back: Normal range of motion and neck supple.     Right lower leg: No edema.     Left lower leg: No edema.  Lymphadenopathy:     Cervical: No cervical adenopathy.  Skin:    General: Skin is warm and dry.  Neurological:     General: No focal deficit present.     Mental Status: She is alert and oriented to person, place, and time.  Psychiatric:        Mood and Affect: Mood normal.        Behavior: Behavior normal.        Thought Content: Thought content normal.           Assessment & Plan:    "

## 2024-03-21 NOTE — Patient Instructions (Signed)
Schedule your complete physical in 6 months We'll notify you of your lab results and make any changes if needed Continue to work on healthy diet and regular exercise- you're doing great! Call with any questions or concerns Stay Safe!  Stay Healthy! Happy New Year!!! 

## 2024-03-21 NOTE — Assessment & Plan Note (Signed)
 Improving.  Pt is down 8 lbs since last visit.  Applauded her efforts.  She is currently on compounded Semaglutide  and feeling great.  Will continue to follow

## 2024-03-21 NOTE — Assessment & Plan Note (Signed)
 Chronic problem.  On Crestor  20mg  daily.  Has strong family hx of hyperlipidemia.  She is down 8 lbs.  Applauded her efforts.  Check labs.  Adjust meds prn

## 2024-03-22 ENCOUNTER — Ambulatory Visit: Payer: Self-pay | Admitting: Family Medicine

## 2024-04-03 ENCOUNTER — Encounter: Payer: Self-pay | Admitting: Family Medicine

## 2024-04-03 DIAGNOSIS — B9689 Other specified bacterial agents as the cause of diseases classified elsewhere: Secondary | ICD-10-CM

## 2024-04-03 NOTE — Telephone Encounter (Signed)
 Patient is requesting a abx to get rid of sinus drainage and congestion last OV 03/21/2024

## 2024-04-04 MED ORDER — AMOXICILLIN 875 MG PO TABS: 875.0000 mg | ORAL_TABLET | Freq: Two times a day (BID) | ORAL | 0 refills | Status: AC

## 2024-04-04 NOTE — Telephone Encounter (Signed)
 [facility] VISIT   Patient agreed to [facility] visit and is aware that copayment and coinsurance may apply. Patient was treated using telemedicine according to accepted telemedicine protocols.  Subjective:   Patient complains of sinus infxn  Patient Active Problem List   Diagnosis Date Noted   Gastroesophageal reflux disease without esophagitis 03/21/2024   Hiatal hernia 03/21/2024   Anemia 03/21/2024   Overweight (BMI 25.0-29.9) 02/01/2023   Iron deficiency anemia 12/14/2019   Osteoporosis 03/15/2017   Distal radius fracture, left 05/03/2016   Physical exam 11/08/2014   Allergic rhinitis 10/02/2014   Hyperlipidemia 05/08/2014   Anxiety state 05/08/2014   Abnormal cervical Papanicolaou smear 05/08/2014   Rectal bleeding 05/08/2014   Skin cancer screening 05/08/2014   Stress fracture of calcaneus 01/17/2013   Cavus foot, 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   Current Medications[1]  Allergies[2]  Assessment and Plan:   Diagnosis: bacterial sinusitis. Please see myChart communication and orders below.   No orders of the defined types were placed in this encounter.  Meds ordered this encounter  Medications   amoxicillin  (AMOXIL ) 875 MG tablet    Sig: Take 1 tablet (875 mg total) by mouth 2 (two) times daily for 10 days.    Dispense:  20 tablet    Refill:  0    Comer Greet, MD 04/04/2024  A total of 6 minutes were spent by me to personally review the patient-generated inquiry, review patient records and data pertinent to assessment of the patient's problem, develop a management plan including generation of prescriptions and/or orders, and on subsequent communication with the patient through  secure the MyChart portal service.   There is no separately reported E/M service related to this service in the past 7 days nor does the patient have an upcoming soonest available appointment for this issue. This work was completed in less than 7 days.   The patient consented to this service today (see patient agreement prior to ongoing communication). Patient counseled regarding the need for in-person exam for certain conditions and was advised to call the office if any changing or worsening symptoms occur.   The codes to be used for the E/M service are: [x]   99421 for 5-10 minutes of time spent on the inquiry. []   [id number] for 11-20 minutes. []   N440385 for 21+ minutes.      [1]  Current Outpatient Medications:    amoxicillin  (AMOXIL ) 875 MG tablet, Take 1 tablet (875 mg total) by mouth 2 (two) times daily for 10 days., Disp: 20 tablet, Rfl: 0   alendronate  (FOSAMAX ) 70 MG tablet, Take 1 tablet (70 mg total) by mouth once a week. Take with a full glass of water on an empty stomach., Disp: 12 tablet, Rfl: 7   Calcium  Citrate-Vitamin D  (CALCIUM  + D PO), Take 1 tablet by mouth 2 (two) times daily., Disp: , Rfl:    cetirizine  (ZYRTEC ) 10 MG tablet, Take 1 tablet (10 mg total) by mouth daily., Disp: 30 tablet, Rfl: 11   clotrimazole-betamethasone (LOTRISONE) cream, PLEASE SEE ATTACHED FOR DETAILED DIRECTIONS, Disp: , Rfl:    FEROSUL 325 (65 Fe) MG tablet, TAKE 1 TABLET BY MOUTH EVERY DAY WITH  BREAKFAST, Disp: 90 tablet, Rfl: 2   meloxicam (MOBIC) 15 MG tablet, Take 15 mg by mouth daily., Disp: , Rfl:    montelukast  (SINGULAIR ) 10 MG tablet, Take 1 tablet (10 mg total) by mouth at bedtime., Disp: 90 tablet, Rfl: 2   omeprazole  (PRILOSEC) 20 MG capsule, Take 1 capsule (20 mg total) by mouth daily., Disp: 90 capsule, Rfl: 1   rosuvastatin  (CRESTOR ) 20 MG tablet, TAKE 1 TABLET DAILY FOR CHOLESTEROL, Disp: 90 tablet, Rfl: 3   sertraline  (ZOLOFT ) 100 MG tablet, TAKE 2 TABLETS DAILY, Disp: 180 tablet, Rfl:  3 [2]  Allergies Allergen Reactions   Levonorgestrel-Ethinyl Estrad Cough

## 2024-09-19 ENCOUNTER — Encounter: Admitting: Family Medicine
